# Patient Record
Sex: Female | Born: 1975 | Hispanic: Yes | Smoking: Never smoker
Health system: Southern US, Community
[De-identification: ages and names within clinical notes are randomized; demographics above are authoritative.]

## PROBLEM LIST (undated history)

## (undated) DIAGNOSIS — D649 Anemia, unspecified: Secondary | ICD-10-CM

## (undated) DIAGNOSIS — N611 Abscess of the breast and nipple: Secondary | ICD-10-CM

## (undated) HISTORY — PX: BREAST CYST ASPIRATION: SHX578

## (undated) HISTORY — PX: BREAST EXCISIONAL BIOPSY: SUR124

---
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
## 2009-09-14 ENCOUNTER — Emergency Department: Payer: Self-pay | Admitting: Emergency Medicine

## 2011-04-26 ENCOUNTER — Ambulatory Visit: Payer: Self-pay | Admitting: Family Medicine

## 2011-08-01 ENCOUNTER — Inpatient Hospital Stay: Payer: Self-pay

## 2013-07-08 ENCOUNTER — Ambulatory Visit: Payer: Self-pay | Admitting: Family Medicine

## 2015-09-10 ENCOUNTER — Other Ambulatory Visit: Payer: Self-pay | Admitting: Physician Assistant

## 2015-09-10 DIAGNOSIS — R2232 Localized swelling, mass and lump, left upper limb: Secondary | ICD-10-CM

## 2015-09-17 ENCOUNTER — Other Ambulatory Visit: Payer: Self-pay | Admitting: Physician Assistant

## 2015-09-18 ENCOUNTER — Other Ambulatory Visit: Payer: Self-pay | Admitting: Physician Assistant

## 2015-09-18 DIAGNOSIS — R2232 Localized swelling, mass and lump, left upper limb: Secondary | ICD-10-CM

## 2015-09-30 ENCOUNTER — Other Ambulatory Visit: Payer: Self-pay | Admitting: Physician Assistant

## 2015-09-30 DIAGNOSIS — R2232 Localized swelling, mass and lump, left upper limb: Secondary | ICD-10-CM

## 2015-10-01 ENCOUNTER — Ambulatory Visit: Payer: BLUE CROSS/BLUE SHIELD

## 2015-10-01 ENCOUNTER — Other Ambulatory Visit: Payer: Self-pay

## 2015-12-28 ENCOUNTER — Ambulatory Visit: Payer: BLUE CROSS/BLUE SHIELD

## 2015-12-28 ENCOUNTER — Other Ambulatory Visit: Payer: Self-pay

## 2016-01-06 ENCOUNTER — Other Ambulatory Visit: Payer: Self-pay

## 2016-01-06 ENCOUNTER — Ambulatory Visit: Payer: BLUE CROSS/BLUE SHIELD

## 2016-01-11 ENCOUNTER — Ambulatory Visit
Admission: RE | Admit: 2016-01-11 | Discharge: 2016-01-11 | Disposition: A | Discharge status: Home / Self Care | Payer: BLUE CROSS/BLUE SHIELD | Source: Ambulatory Visit | Attending: Physician Assistant | Admitting: Physician Assistant

## 2016-01-11 ENCOUNTER — Other Ambulatory Visit: Payer: Self-pay | Admitting: Physician Assistant

## 2016-01-11 DIAGNOSIS — N63 Unspecified lump in breast: Secondary | ICD-10-CM | POA: Insufficient documentation

## 2016-01-11 DIAGNOSIS — R2232 Localized swelling, mass and lump, left upper limb: Secondary | ICD-10-CM

## 2016-05-26 ENCOUNTER — Other Ambulatory Visit: Payer: Self-pay | Admitting: Family Medicine

## 2016-05-26 DIAGNOSIS — R928 Other abnormal and inconclusive findings on diagnostic imaging of breast: Secondary | ICD-10-CM

## 2016-07-11 ENCOUNTER — Ambulatory Visit: Admission: RE | Admit: 2016-07-11 | Payer: BLUE CROSS/BLUE SHIELD | Source: Ambulatory Visit

## 2016-08-08 ENCOUNTER — Ambulatory Visit
Admission: RE | Admit: 2016-08-08 | Discharge: 2016-08-08 | Disposition: A | Discharge status: Home / Self Care | Payer: BLUE CROSS/BLUE SHIELD | Source: Ambulatory Visit | Attending: Family Medicine | Admitting: Family Medicine

## 2016-08-08 DIAGNOSIS — R928 Other abnormal and inconclusive findings on diagnostic imaging of breast: Secondary | ICD-10-CM | POA: Diagnosis present

## 2016-08-08 DIAGNOSIS — N63 Unspecified lump in breast: Secondary | ICD-10-CM | POA: Insufficient documentation

## 2017-01-02 ENCOUNTER — Other Ambulatory Visit: Payer: Self-pay | Admitting: Physician Assistant

## 2017-01-02 DIAGNOSIS — R928 Other abnormal and inconclusive findings on diagnostic imaging of breast: Secondary | ICD-10-CM

## 2017-02-01 ENCOUNTER — Ambulatory Visit
Admission: RE | Admit: 2017-02-01 | Discharge: 2017-02-01 | Disposition: A | Discharge status: Home / Self Care | Payer: BLUE CROSS/BLUE SHIELD | Source: Ambulatory Visit | Attending: Physician Assistant | Admitting: Physician Assistant

## 2017-02-01 DIAGNOSIS — R928 Other abnormal and inconclusive findings on diagnostic imaging of breast: Secondary | ICD-10-CM

## 2018-02-06 ENCOUNTER — Other Ambulatory Visit: Payer: Self-pay | Admitting: Physician Assistant

## 2018-02-06 DIAGNOSIS — R928 Other abnormal and inconclusive findings on diagnostic imaging of breast: Secondary | ICD-10-CM

## 2018-03-02 ENCOUNTER — Ambulatory Visit
Admission: RE | Admit: 2018-03-02 | Discharge: 2018-03-02 | Disposition: A | Discharge status: Home / Self Care | Payer: Self-pay | Source: Ambulatory Visit | Attending: Physician Assistant | Admitting: Physician Assistant

## 2018-03-02 DIAGNOSIS — R928 Other abnormal and inconclusive findings on diagnostic imaging of breast: Secondary | ICD-10-CM | POA: Insufficient documentation

## 2018-03-02 DIAGNOSIS — N6313 Unspecified lump in the right breast, lower outer quadrant: Secondary | ICD-10-CM | POA: Insufficient documentation

## 2018-03-02 DIAGNOSIS — N6311 Unspecified lump in the right breast, upper outer quadrant: Secondary | ICD-10-CM | POA: Insufficient documentation

## 2019-02-16 ENCOUNTER — Emergency Department: Payer: Self-pay

## 2019-02-16 ENCOUNTER — Other Ambulatory Visit: Payer: Self-pay

## 2019-02-16 ENCOUNTER — Emergency Department
Admission: EM | Admit: 2019-02-16 | Discharge: 2019-02-16 | Disposition: A | Discharge status: Home / Self Care | Payer: Self-pay | Attending: Emergency Medicine | Admitting: Emergency Medicine

## 2019-02-16 ENCOUNTER — Encounter: Payer: Self-pay | Admitting: Emergency Medicine

## 2019-02-16 DIAGNOSIS — L0291 Cutaneous abscess, unspecified: Secondary | ICD-10-CM

## 2019-02-16 DIAGNOSIS — N649 Disorder of breast, unspecified: Secondary | ICD-10-CM

## 2019-02-16 DIAGNOSIS — N6459 Other signs and symptoms in breast: Secondary | ICD-10-CM | POA: Insufficient documentation

## 2019-02-16 LAB — COMPREHENSIVE METABOLIC PANEL
ALBUMIN: 4.1 g/dL (ref 3.5–5.0)
ALT: 16 U/L (ref 0–44)
AST: 24 U/L (ref 15–41)
Alkaline Phosphatase: 108 U/L (ref 38–126)
Anion gap: 8 (ref 5–15)
BUN: 15 mg/dL (ref 6–20)
CALCIUM: 8.8 mg/dL — AB (ref 8.9–10.3)
CO2: 26 mmol/L (ref 22–32)
CREATININE: 0.69 mg/dL (ref 0.44–1.00)
Chloride: 107 mmol/L (ref 98–111)
GFR calc Af Amer: >60 mL/min (ref >60)
GFR calc non Af Amer: >60 mL/min (ref >60)
GLUCOSE: 99 mg/dL (ref 70–99)
Potassium: 3.5 mmol/L (ref 3.5–5.1)
SODIUM: 141 mmol/L (ref 135–145)
Total Bilirubin: 0.2 mg/dL — ABNORMAL LOW (ref 0.3–1.2)
Total Protein: 7.5 g/dL (ref 6.5–8.1)

## 2019-02-16 LAB — CBC WITH DIFFERENTIAL/PLATELET
ABS IMMATURE GRANULOCYTES: 0.03 10*3/uL (ref 0.00–0.07)
Basophils Absolute: 0.1 10*3/uL (ref 0.0–0.1)
Basophils Relative: 1 %
EOS PCT: 3 %
Eosinophils Absolute: 0.2 10*3/uL (ref 0.0–0.5)
HEMATOCRIT: 40.9 % (ref 36.0–46.0)
Hemoglobin: 14.1 g/dL (ref 12.0–15.0)
Immature Granulocytes: 0 %
LYMPHS ABS: 2.3 10*3/uL (ref 0.7–4.0)
Lymphocytes Relative: 25 %
MCH: 31.5 pg (ref 26.0–34.0)
MCHC: 34.5 g/dL (ref 30.0–36.0)
MCV: 91.3 fL (ref 80.0–100.0)
MONO ABS: 0.6 10*3/uL (ref 0.1–1.0)
Monocytes Relative: 7 %
Neutro Abs: 5.9 10*3/uL (ref 1.7–7.7)
Neutrophils Relative %: 64 %
Platelets: 590 10*3/uL — ABNORMAL HIGH (ref 150–400)
RBC: 4.48 MIL/uL (ref 3.87–5.11)
RDW: 11.9 % (ref 11.5–15.5)
WBC: 9.2 10*3/uL (ref 4.0–10.5)
nRBC: 0 % (ref 0.0–0.2)

## 2019-02-16 LAB — LACTIC ACID, PLASMA: Lactic Acid, Venous: 1.5 mmol/L (ref 0.5–1.9)

## 2019-02-16 MED ORDER — CEPHALEXIN 500 MG PO CAPS: 500.0000 mg | ORAL_CAPSULE | Freq: Four times a day (QID) | ORAL | 0 refills | Status: DC

## 2019-02-16 MED ORDER — SULFAMETHOXAZOLE-TRIMETHOPRIM 800-160 MG PO TABS: 1.0000 | ORAL_TABLET | Freq: Two times a day (BID) | ORAL | 0 refills | Status: DC

## 2019-02-16 MED ORDER — CEPHALEXIN 500 MG PO CAPS
500.0000 mg | ORAL_CAPSULE | Freq: Once | ORAL | Status: AC
Start: 2019-02-16 — End: 2019-02-16
  Administered 2019-02-16: 500 mg via ORAL
  Filled 2019-02-16: qty 1

## 2019-02-16 MED ORDER — SULFAMETHOXAZOLE-TRIMETHOPRIM 800-160 MG PO TABS
1.0000 | ORAL_TABLET | Freq: Once | ORAL | Status: AC
  Administered 2019-02-16: 1 via ORAL
  Filled 2019-02-16: qty 1

## 2019-02-16 NOTE — ED Notes (Signed)
ED Provider and interpreter at bedside at bedside.

## 2019-02-16 NOTE — ED Notes (Signed)
Quarter sized redness just right of areola of left breast; no obvious swelling present

## 2019-02-16 NOTE — ED Triage Notes (Signed)
Interpreter Marchelle Folks present in triage; pt says she was in the ER in [facility] a week ago and had a lump in her left breast; put on Clindamycin and has been taking since 02/10/19; pt says the area is larger and the "area has changed color"; area is "reddish purple"; denies drainage; denies fever; pt in no acute distress;

## 2019-02-16 NOTE — ED Provider Notes (Signed)
[facility] Emergency Department Provider Note  ____________________________________________  Time seen: Approximately 7:40 PM  I have reviewed the triage vital signs and the nursing notes.   HISTORY  Chief Complaint Breast Mass    HPI Tamara Downs is a 43 y.o. female who presents the emergency department complaining of worsening lesion to the left breast.  Patient was seen in [facility] in emergency department but patient was unable to advise which emergency department for a breast abscess approximately a week ago.  Patient has been on clindamycin but states that the lesion has been worsening and regards to both size and pain.  Patient has no overlying skin changes.  She denies any drainage from the nipple.  No fevers or chills, abdominal pain, nausea or vomiting, chest pain or shortness of breath.  Other than clindamycin, no medications for his complaint prior to arrival.         History reviewed. No pertinent past medical history.  There are no active problems to display for this patient.   History reviewed. No pertinent surgical history.  Prior to Admission medications   Medication Sig Start Date End Date Taking? Authorizing Provider  clindamycin (CLEOCIN) 300 MG capsule Take 300 mg by mouth 4 (four) times daily.   Yes [provider]  cephALEXin (KEFLEX) 500 MG capsule Take 1 capsule (500 mg total) by mouth 4 (four) times daily. 02/16/19   Inioluwa Baris, Charline Bills, PA-C  sulfamethoxazole-trimethoprim (BACTRIM DS,SEPTRA DS) 800-160 MG tablet Take 1 tablet by mouth 2 (two) times daily. 02/16/19   Modell Fendrick, Charline Bills, PA-C    Allergies Patient has no known allergies.  Family History  Problem Relation Age of Onset  . BRCA 1/2 Neg Hx     Social History Social History   Tobacco Use  . Smoking status: Never Smoker  . Smokeless tobacco: Never Used  Substance Use Topics  . Alcohol use: Never    Frequency: Never  . Drug use:  Never     Review of Systems  Constitutional: No fever/chills Eyes: No visual changes.  Cardiovascular: no chest pain. Respiratory: no cough. No SOB. Gastrointestinal: No abdominal pain.  No nausea, no vomiting.  No diarrhea.  No constipation. Musculoskeletal: Negative for musculoskeletal pain. Skin: Negative for rash, abrasions, lacerations, ecchymosis.  Positive for worsening breast abscess to the left breast Neurological: Negative for headaches, focal weakness or numbness. 10-point ROS otherwise negative.  ____________________________________________   PHYSICAL EXAM:  VITAL SIGNS: ED Triage Vitals  Enc Vitals Group     BP 02/16/19 1843 110/70     Pulse Rate 02/16/19 1843 68     Resp 02/16/19 1843 18     Temp 02/16/19 1843 98.4 F (36.9 C)     Temp Source 02/16/19 1843 Oral     SpO2 02/16/19 1843 100 %     Weight 02/16/19 1844 144 lb (65.3 kg)     Height 02/16/19 1844 _0  (1.575 m)     Head Circumference --      Peak Flow --      Pain Score 02/16/19 1905 0     Pain Loc --      Pain Edu? --      Excl. in [place]? --      Constitutional: Alert and oriented. Well appearing and in no acute distress. Eyes: Conjunctivae are normal. PERRL. EOMI. Head: Atraumatic. Neck: No stridor.   Hematological/Lymphatic/Immunilogical: No cervical lymphadenopathy.  No axillary lymphadenopathy appreciated. Cardiovascular: Normal rate, regular rhythm. Normal S1  and S2.  Good peripheral circulation. Respiratory: Normal respiratory effort without tachypnea or retractions. Lungs CTAB. Good air entry to the bases with no decreased or absent breath sounds. Musculoskeletal: Full range of motion to all extremities. No gross deformities appreciated. Neurologic:  Normal speech and language. No gross focal neurologic deficits are appreciated.  Skin:  Skin is warm, dry and intact. No rash noted.  Visualization of the left breast reveals no overlying skin changes of erythema, edema, no peau d'orange.  No  visual changes to the nipple with retraction, discharge.  On palpation, patient has a very firm, painful lesion noted to the left medial breast.  This measures approximately 6 x 8 cm in the breast tissue.  No fluctuance appreciated.  No other palpable lesions noted to the left breast. Psychiatric: Mood and affect are normal. Speech and behavior are normal. Patient exhibits appropriate insight and judgement.   ____________________________________________   LABS (all labs ordered are listed, but only abnormal results are displayed)  Labs Reviewed  COMPREHENSIVE METABOLIC PANEL - Abnormal; Notable for the following components:      Result Value   Calcium 8.8 (*)    Total Bilirubin 0.2 (*)    All other components within normal limits  CBC WITH DIFFERENTIAL/PLATELET - Abnormal; Notable for the following components:   Platelets 590 (*)    All other components within normal limits  CULTURE, BLOOD (ROUTINE X 2)  CULTURE, BLOOD (ROUTINE X 2)  LACTIC ACID, PLASMA   ____________________________________________  EKG   ____________________________________________  RADIOLOGY I personally viewed and evaluated these images as part of my medical decision making, as well as reviewing the written report by the radiologist.  Due to interface issue, ultrasound report was not crossing over into epic.  Report was provided by radiology group.  Findings: Targeted ultrasound was performed, showing a hypoechoic region at 9:00 measuring 1.0 x 0.8 x 0.7 cm with no internal blood flow.  Immediately adjacent to this hypoechoic region is a smaller hypoechoic region measuring 7 x 6 mm.  There may be mild hyperemia in this region.  There appears to be mild skin thickening as well.  Impression:  2 rounded hypoechoic regions in the region of this patient's pain and erythema.  The largest measures 10 x 8 x 7 mm.  There may be mild hyperemia in the region.  Based on imaging, this could represent 2 solid masses.   However based on history I suspect the findings represent 2 small abscesses or phlegmon's.  Recommendation: Recommend continuing the patient on antibiotics and sending patient to the breast center for further evaluation which would include mammography if patient can tolerate an ultrasound.  Attempted abscess drainage could be performed if clinically necessary at this time.  These findings will need to be followed to complete resolution.  No results found.  ____________________________________________    PROCEDURES  Procedure(s) performed:    Procedures    Medications  cephALEXin (KEFLEX) capsule 500 mg (has no administration in time range)  sulfamethoxazole-trimethoprim (BACTRIM DS,SEPTRA DS) 800-160 MG per tablet 1 tablet (has no administration in time range)     ____________________________________________   INITIAL IMPRESSION / ASSESSMENT AND PLAN / ED COURSE  Pertinent labs & imaging results that were available during my care of the patient were reviewed by me and considered in my medical decision making (see chart for details).  Review of the [place] CSRS was performed in accordance of the [place] prior to dispensing any controlled drugs.  Patient's diagnosis is consistent with breast lesion.  Patient presents emergency department with a complaint of a worsening lesion to the left breast.  On exam, patient has significant palpable abnormality to the left medial breast tissue.  Ultrasound of the area reveals to lesions in the left breast.  They recommend follow-up with breast care center for further evaluation and management.  Patient will be given prescriptions for Bactrim and Keflex as clindamycin does not seem to have adequately treated this area.  At this time, no attempts at drainage will be performed.  Follow-up with breast center as recommended.. Patient is given ED precautions to return to the ED for any worsening or new  symptoms.     ____________________________________________  FINAL CLINICAL IMPRESSION(S) / ED DIAGNOSES  Final diagnoses:  Abscess  Breast lesion      NEW MEDICATIONS STARTED DURING THIS VISIT:  ED Discharge Orders         Ordered    cephALEXin (KEFLEX) 500 MG capsule  4 times daily     02/16/19 2238    sulfamethoxazole-trimethoprim (BACTRIM DS,SEPTRA DS) 800-160 MG tablet  2 times daily     02/16/19 2238              This chart was dictated using voice recognition software/Dragon. Despite best efforts to proofread, errors can occur which can change the meaning. Any change was purely unintentional.    Darletta Moll, PA-C 02/16/19 2238    Nance Pear, MD 02/16/19 [phone]

## 2019-02-16 NOTE — ED Notes (Signed)
Peripheral IV discontinued. Catheter intact. No signs of infiltration or redness. Gauze applied to IV site.    Discharge instructions reviewed with patient. Questions fielded by this RN. Patient verbalizes understanding of instructions. Patient discharged home in stable condition per [place], [place]. No acute distress noted at time of discharge.

## 2019-02-16 NOTE — ED Notes (Addendum)
Provider and Mya with interpreter at bedside: pt c/o mass in left medial  breast, reports taking clindamycin as prescribed but reports not getting better  Area appears without significant warmth or redness, firm mass palpated, pain with palpation  Pt reports aunt with hx of breast CA, pt denies surgical or significant medical hx with meds, 2 children at home   Hx of nodes or masses in breasts without malignancy

## 2019-02-16 NOTE — ED Notes (Signed)
Patient transported to Ultrasound 

## 2019-02-20 ENCOUNTER — Ambulatory Visit: Payer: Self-pay | Attending: Oncology

## 2019-02-20 ENCOUNTER — Other Ambulatory Visit: Payer: Self-pay

## 2019-02-20 ENCOUNTER — Telehealth: Payer: Self-pay | Admitting: *Deleted

## 2019-02-20 VITALS — BP 97/66 | HR 67 | Temp 98.5°F | Ht 62.25 in | Wt 142.0 lb

## 2019-02-20 DIAGNOSIS — N63 Unspecified lump in unspecified breast: Secondary | ICD-10-CM

## 2019-02-20 NOTE — Telephone Encounter (Signed)
"  I am Hilda Lias an interpreter from [facility] calling for [facility] [place] who was seen in the ED.    ED provided her with this number advising her to call call the Cancer Center.    What is she to do?    Do you all have a BCCCP Program in [place]?  She lives in [place], goes to [facility] in [place] but she has to pay.  Yes, she was given discharge papers but these are in her boyfriends car."    Reviewed EMR.  Seen in [place] ED.  Will notify new patient coordinator.  [facility] in [place] not noted by  This nurse at this time.  Provided ED recommendations to continue antibiotics and report. To Breast Center [phone]) for breast LTD uni left inc [place].

## 2019-02-20 NOTE — Progress Notes (Signed)
  Subjective:     Patient ID: Tamara Downs, female   DOB: 1976-09-25, 43 y.o.   MRN: [id number]  HPI   Review of Systems     Objective:   Physical Exam Chest:     Breasts:        Left: Mass, skin change and tenderness present.       Comments: 9 cm , firm  left inner breast mass with 3 cm erytmatous area adjacent to areola          Assessment:     43 year old hispanic patient presents for BCCCP clinic visit.  Patient was originally scheduled for BCCCP screening on 03/08/2019, but two weeks ago she presented to the [facility] ED, and was treated for left breast cellulitis with Clindamycin. On 02/16/2019 she presented at [facility] ED with worsening left breast symptoms.  A breast ultrasound was performed, and she was placed on Bactrim and Keflex.  She phoned today stating she was having increased pain, and the area of concern has increased in size.  Also reports breast is purple in color. Patient screened, and meets BCCCP eligibility.  Patient does not have insurance, Medicare or Medicaid.  Handout given on Affordable Care Act.  Instructed patient on breast self awareness using teach back method.  On Clinical breast exam, palpated a  9 cm , firm  left inner breast mass with 3 cm erytmatous area adjacent to areola  .    Plan:     Scheduled consult and possible biopsy with Dr. Earlene Plater at [facility] on 02/21/19 at 2:45.  Patient to arrive at 2:30 with current medications.

## 2019-02-21 ENCOUNTER — Encounter: Payer: Self-pay | Admitting: Surgery

## 2019-02-21 ENCOUNTER — Ambulatory Visit (INDEPENDENT_AMBULATORY_CARE_PROVIDER_SITE_OTHER): Payer: Self-pay | Admitting: Surgery

## 2019-02-21 ENCOUNTER — Ambulatory Visit (INDEPENDENT_AMBULATORY_CARE_PROVIDER_SITE_OTHER): Payer: Self-pay

## 2019-02-21 VITALS — BP 119/78 | HR 91 | Temp 99.9°F | Ht 64.0 in | Wt 142.0 lb

## 2019-02-21 DIAGNOSIS — N6320 Unspecified lump in the left breast, unspecified quadrant: Secondary | ICD-10-CM

## 2019-02-21 DIAGNOSIS — N611 Abscess of the breast and nipple: Secondary | ICD-10-CM

## 2019-02-21 LAB — CULTURE, BLOOD (ROUTINE X 2)
CULTURE: NO GROWTH
Culture: NO GROWTH
Special Requests: ADEQUATE

## 2019-02-21 NOTE — Patient Instructions (Signed)
Return in one week. The patient is aware to call back for any questions or concerns. 

## 2019-02-21 NOTE — Progress Notes (Signed)
Surgical Clinic History and Physical  Referring provider:  Freddy Finner, NP [place], [place] [address]  HISTORY OF PRESENT ILLNESS (HPI):  43 y.o. female presents for evaluation of Left breast pain. Patient reports she first noticed her Left breast pain 2 weeks ago, at which time she presented to [facility] ED and was prescribed clindamycin. Because her pain did not improve and the area involved became larger, she then 5 days ago (3/7) presented to [facility] ED, at which time she underwent Left breast ultrasound and her antibiotics were changed to Keflex + Bactrim. At the time of both prior presentations, no overlying skin changes were reported. Patient then called BCCCP yesterday for breast discoloration with pain and was referred to surgery office for further evaluation and management. Patient currently reports persistent Left breast pain and temperatures between 15F to 100F, denies prior similar episodes. Patient states an aunt of hers was diagnosed with breast cancer at 42 years old. Patient otherwise denies nipple drainage, N/V, CP, or SOB.  PAST MEDICAL HISTORY ([facility]):  History reviewed. No pertinent past medical history.   PAST SURGICAL HISTORY ([place]):  History reviewed. No pertinent surgical history.   MEDICATIONS:  Prior to Admission medications   Medication Sig Start Date End Date Taking? Authorizing Provider  sulfamethoxazole-trimethoprim (BACTRIM DS,SEPTRA DS) 800-160 MG tablet Take 1 tablet by mouth 2 (two) times daily. 02/16/19  Yes Cuthriell, Charline Bills, PA-C  cephALEXin (KEFLEX) 500 MG capsule Take 1 capsule (500 mg total) by mouth 4 (four) times daily. 02/16/19   Cuthriell, Charline Bills, PA-C  clindamycin (CLEOCIN) 300 MG capsule Take 300 mg by mouth 4 (four) times daily.    [provider]    ALLERGIES:  No Known Allergies   SOCIAL HISTORY:  Social History   Socioeconomic History  . Marital status: Married    Spouse name: Not on file  .  Number of children: Not on file  . Years of education: Not on file  . Highest education level: Not on file  Occupational History  . Not on file  Social Needs  . Financial resource strain: Not on file  . Food insecurity:    Worry: Not on file    Inability: Not on file  . Transportation needs:    Medical: Not on file    Non-medical: Not on file  Tobacco Use  . Smoking status: Never Smoker  . Smokeless tobacco: Never Used  Substance and Sexual Activity  . Alcohol use: Never    Frequency: Never  . Drug use: Never  . Sexual activity: Not on file  Lifestyle  . Physical activity:    Days per week: Not on file    Minutes per session: Not on file  . Stress: Not on file  Relationships  . Social connections:    Talks on phone: Not on file    Gets together: Not on file    Attends religious service: Not on file    Active member of club or organization: Not on file    Attends meetings of clubs or organizations: Not on file    Relationship status: Not on file  . Intimate partner violence:    Fear of current or ex partner: Not on file    Emotionally abused: Not on file    Physically abused: Not on file    Forced sexual activity: Not on file  Other Topics Concern  . Not on file  Social History Narrative  . Not on file  The patient currently resides (home / rehab facility / nursing home): Home The patient normally is (ambulatory / bedbound): Ambulatory  FAMILY HISTORY:  Family History  Problem Relation Age of Onset  . BRCA 1/2 Neg Hx     Otherwise negative/non-contributory.  REVIEW OF SYSTEMS:  Constitutional: denies any other weight loss, fever, chills, or sweats  Eyes: denies any other vision changes, history of eye injury  ENT: denies sore throat, hearing problems  Respiratory: denies shortness of breath, wheezing  Cardiovascular: denies chest pain, palpitations Breast: pain, redness, mass(es), and nipple drainage as per HPI Gastrointestinal: denies abdominal pain,  N/V, or diarrhea Musculoskeletal: denies any other joint pains or cramps Skin: Denies any other rashes or skin discolorations except as per HPI Neurological: denies any other headache, dizziness, weakness  Psychiatric: Denies any other depression, anxiety   All other review of systems were otherwise negative   VITAL SIGNS:  BP 119/78   Pulse 91   Temp 99.9 F (37.7 C)   Ht '5\' 4"'  (1.626 m)   Wt 142 lb (64.4 kg)   LMP 02/11/2019 (Exact Date)   SpO2 97%   BMI 24.37 kg/m   PHYSICAL EXAM:  Constitutional:  -- Normal body habitus  -- Awake, alert, and oriented x3  Eyes:  -- Pupils equally round and reactive to light  -- No scleral icterus  Ear, nose, throat:  -- No jugular venous distension -- No nasal drainage, bleeding Pulmonary:  -- No crackles  -- Equal breath sounds bilaterally -- Breathing non-labored at rest Cardiovascular:  -- S1, S2 present  -- No pericardial rubs  Breast: -- 7 cm (ML) x 3 cm (CC) firm induration, extending from subareola towards medially with 2 cm focal erythema focally worse tenderness to palpation medial to areola at 9 o'clock position -- B/L no other breast mass(es), nipple drainage, or axillary lymphadenopathy appreciated Gastrointestinal:  -- Abdomen soft, non-tender, non-distended, no guarding/rebound tenderness -- No abdominal masses appreciated, pulsatile or otherwise  Musculoskeletal and Integumentary:  -- Wounds or skin discoloration: None appreciated except as described above (Breast) -- Extremities: B/L UE and LE FROM, hands and feet warm, no edema  Neurologic:  -- Motor function: Intact and symmetric -- Sensation: Intact and symmetric  Labs:  CBC Latest Ref Rng & Units 02/16/2019  WBC 4.0 - 10.5 K/uL 9.2  Hemoglobin 12.0 - 15.0 g/dL 14.1  Hematocrit 36.0 - 46.0 % 40.9  Platelets 150 - 400 K/uL 590(H)   CMP Latest Ref Rng & Units 02/16/2019  Glucose 70 - 99 mg/dL 99  BUN 6 - 20 mg/dL 15  Creatinine 0.44 - 1.00 mg/dL 0.69   Sodium 135 - 145 mmol/L 141  Potassium 3.5 - 5.1 mmol/L 3.5  Chloride 98 - 111 mmol/L 107  CO2 22 - 32 mmol/L 26  Calcium 8.9 - 10.3 mg/dL 8.8(L)  Total Protein 6.5 - 8.1 g/dL 7.5  Total Bilirubin 0.3 - 1.2 mg/dL 0.2(L)  Alkaline Phos 38 - 126 U/L 108  AST 15 - 41 U/L 24  ALT 0 - 44 U/L 16   Imaging studies:  Left Breast Focal Ultrasound (02/16/2019) Targeted ultrasound is performed, showing a hypoechoic region at 9 o'clock measuring 1.0 x 0.8 x 0.7 cm with no internal blood flow. Immediately adjacent to this hypoechoic region is a smaller hypoechoic region measuring 7 x 6 mm. There may be mild hyperemia in this region. There appears to be mild skin thickening as well.  Two rounded hypoechoic regions in the region of  the patient's pain and erythema. The larger measures 10 x 8 x 7 mm. There may be mild hyperemia in this region. Based on imaging, this could represent 2 solid masses. However, based on history, I suspect the findings represent 2 small abscesses or phlegmons.   Assessment/Plan:  43 y.o. female with Left breast abscess and surrounding induration, developing over past 2 weeks.   - discussed with patient aspiration vs incision and drainage of abscess  - all risks, benefits, and alternatives to ultrasound-guided aspiration of Left breast abscess were discussed with the patient and her husband, all of their questions were answered to their expressed satisfaction, patient expresses she wishes to proceed with ultrasound-guided aspiration of her Left breast abscess, and informed consent was obtained accordingly.\  - ultrasound-guided aspiration of Left breast abscess performed, will follow up cultures  - complete prescribed course of antibiotics (patient reports 3 - 5 days remaining)  - may remove adhesive bandage later today and apply warm compresses, may shower  - return to clinic in 1 week with follow-up clinical exam and ultrasound  - discussed repeat aspiration vs incision  and drainage if not resolved  - instructed to call office if any questions or concerns  All of the above recommendations were discussed with the patient and patient's husband, and all of patient's and family's questions were answered to their expressed satisfaction.  Thank you for the opportunity to participate in this patient's care.  -- Marilynne Drivers Rosana Hoes, MD, [place]: [facility] - Partnering for exceptional care. Office: [phone]

## 2019-02-21 NOTE — Procedures (Addendum)
SURGICAL OPERATIVE REPORT  DATE OF PROCEDURE: 02/21/2019  ATTENDING: Barbara Cower E. Earlene Plater, MD  ANESTHESIA: Local  PRE-OPERATIVE DIAGNOSIS: Left breast abscess (icd-10's: N61.1)  POST-OPERATIVE DIAGNOSIS: Left breast abscess (icd-10's: N61.1)  PROCEDURE(S):  1.) Focal Left breast ultrasound (cpt: 00867) 2.) Ultrasound-guided aspiration of Left breast abscesses (cpt: 10160)  INTRAOPERATIVE FINDINGS: ~4 mL of pus mixed with blood from a 1.3 cm x 0.7 cm Left medial breast abscess and a small amount from a second smaller adjacent subcentimeter hypoechoic lesion as well; immediate reported relief of "pressure" following aspiration, a newly soft central area overlying site of aspirated abscess, and improved erythema  INTRAVENOUS FLUIDS: 0 mL crystalloid   ESTIMATED BLOOD LOSS: Minimal (<20 mL)   URINE OUTPUT: No Foley catheter   SPECIMENS: Contents of Left breast abscess for culture (container for histology not available at the time of aspiration)  IMPLANTS: None  DRAINS: None  COMPLICATIONS: None apparent  CONDITION AT END OF PROCEDURE: Hemodynamically stable and awake  INDICATIONS FOR PROCEDURE:  Patient is a 43 y.o. female who presented to outpatient surgery office for evaluation of Left breast pain. Patient reports she first noticed her Left breast pain 2 weeks ago, at which time she presented to [facility] ED and was prescribed clindamycin. Because her pain did not improve and the area involved became larger, she 5 days ago (3/7) presented to [facility] ED, at which time she underwent Left breast ultrasound and antibiotics were changed to Keflex + Bactrim. At the time of both prior presentations, no overlying skin changes were reported. Patient then called BCCCP yesterday for breast discoloration with pain and was referred to surgery office for further evaluation and management. Patient reports persistent Left breast pain and  temperatures between 16F to 100F, denies prior similar episodes. Patient states an aunt of hers was diagnosed with breast cancer at 42 years old. Patient otherwise denies nipple drainage, N/V, CP, or SOB. All risks, benefits, and alternatives to aspiration vs incision and drainage of Left breast abscess were discussed with patient, all of her questions were answered to her expressed satisfaction, and informed consent was obtained.  DETAILS OF PROCEDURE: Patient was brought to the procedure room and appropriately identified. In supine position, bedside ultrasound was performed to assess for abscess amenable to percutaneous aspiration. Procedural site was cleaned with alcohol-based solution, and following a brief time out, local anesthetic was injected using a 25G needle, and a 20G needle was used to aspirate the larger of two hypoechoic lesions consistent with abscess cavities. and a ~4 mL of pus mixed with blood from a 1.3 cm x 0.7 cm Left medial breast abscess and a small amount of pus from a second smaller adjacent subcentimeter hypoechoic lesion as well were obtained and placed in culture media. Via certified medical Spanish-English translator, patient expressed immediate relief from pre-aspiration "pressure" with improved erythema. Skin was then cleaned and dried, and an adhesive bandage was applied.  I was present for all aspects of the above procedure, and no operative complications were apparent.

## 2019-02-27 LAB — ANAEROBIC AND AEROBIC CULTURE

## 2019-02-28 ENCOUNTER — Other Ambulatory Visit: Payer: Self-pay

## 2019-02-28 ENCOUNTER — Encounter: Payer: Self-pay | Admitting: Surgery

## 2019-02-28 ENCOUNTER — Ambulatory Visit (INDEPENDENT_AMBULATORY_CARE_PROVIDER_SITE_OTHER): Payer: Self-pay | Admitting: Surgery

## 2019-02-28 VITALS — BP 104/71 | HR 63 | Temp 97.5°F | Ht 64.0 in | Wt 142.4 lb

## 2019-02-28 DIAGNOSIS — N611 Abscess of the breast and nipple: Secondary | ICD-10-CM

## 2019-02-28 DIAGNOSIS — Z4889 Encounter for other specified surgical aftercare: Secondary | ICD-10-CM

## 2019-02-28 HISTORY — DX: Abscess of the breast and nipple: N61.1
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
## 2019-02-28 NOTE — Patient Instructions (Addendum)
Patient is to return to the office in 2 weeks.   Call the office with any questions or concerns.  

## 2019-03-04 NOTE — Progress Notes (Signed)
Per follow-up with Dr. Earlene Plater, patient improved.  To return to his office in two weeks for follow-up.  Copy to [facility].

## 2019-03-06 ENCOUNTER — Ambulatory Visit: Payer: Self-pay

## 2019-03-11 ENCOUNTER — Ambulatory Visit (INDEPENDENT_AMBULATORY_CARE_PROVIDER_SITE_OTHER): Payer: Self-pay | Admitting: Surgery

## 2019-03-11 ENCOUNTER — Other Ambulatory Visit: Payer: Self-pay

## 2019-03-11 ENCOUNTER — Encounter: Payer: Self-pay | Admitting: Surgery

## 2019-03-11 ENCOUNTER — Telehealth: Payer: Self-pay

## 2019-03-11 ENCOUNTER — Encounter: Payer: Self-pay | Admitting: *Deleted

## 2019-03-11 VITALS — BP 99/65 | HR 72 | Temp 97.7°F | Resp 16 | Ht 62.0 in | Wt 142.8 lb

## 2019-03-11 DIAGNOSIS — N611 Abscess of the breast and nipple: Secondary | ICD-10-CM

## 2019-03-11 MED ORDER — CLINDAMYCIN PHOSPHATE 900 MG/50ML IV SOLN
900.0000 mg | INTRAVENOUS | Status: AC
  Administered 2019-03-12: 900 mg via INTRAVENOUS

## 2019-03-11 MED ORDER — AMOXICILLIN-POT CLAVULANATE 875-125 MG PO TABS: 1.0000 | ORAL_TABLET | Freq: Two times a day (BID) | ORAL | 0 refills | Status: AC

## 2019-03-11 NOTE — H&P (View-Only) (Signed)
Patient ID: Tamara Downs, female   DOB: August 26, 1976, 43 y.o.   MRN: [id number]  HPI Faten Frieson is a 43 y.o. female known to our practice with a prior history of breast abscess aspirated by my partner Dr. Tama High 2-1/2 weeks ago.  She states that now she is got some drainage and she was recently prescribed Keflex by PCP.  She now reports decreasing drainage.  No fevers no chills.  She does have significant pain on her left breast that is sharp intermittent and worsening when she wears a bra.  Did have an ultrasound of the left breast that I have personally reviewed showing this of 2 small collections.  There is no growth on the aspirate.   HPI  History reviewed. No pertinent past medical history.  History reviewed. No pertinent surgical history.  Family History  Problem Relation Age of Onset  . BRCA 1/2 Neg Hx     Social History Social History   Tobacco Use  . Smoking status: Never Smoker  . Smokeless tobacco: Never Used  Substance Use Topics  . Alcohol use: Never    Frequency: Never  . Drug use: Never    No Known Allergies  Current Outpatient Medications  Medication Sig Dispense Refill  . cephALEXin (KEFLEX) 500 MG capsule TAKE 1 CAPSULE BY MOUTH THREE TIMES DAILY FOR INFECTION FOR 10 DAYS    . amoxicillin-clavulanate (AUGMENTIN) 875-125 MG tablet Take 1 tablet by mouth 2 (two) times daily for 10 days. 20 tablet 0   No current facility-administered medications for this visit.      Review of Systems Full ROS  was asked and was negative except for the information on the HPI  Physical Exam Blood pressure 99/65, pulse 72, temperature 97.7 F (36.5 C), temperature source Temporal, resp. rate 16, height '5\' 2"'  (1.575 m), weight 142 lb 12.8 oz (64.8 kg), last menstrual period 02/11/2019, SpO2 98 %. CONSTITUTIONAL: NAD EYES: Pupils are equal, round, and reactive to light, Sclera are non-icteric. EARS, NOSE, MOUTH AND THROAT: The oropharynx is clear.  The oral mucosa is pink and moist. Hearing is intact to voice. LYMPH NODES:  Lymph nodes in the neck are normal. RESPIRATORY:  Lungs are clear. There is normal respiratory effort, with equal breath sounds bilaterally, and without pathologic use of accessory muscles. CARDIOVASCULAR: Heart is regular without murmurs, gallops, or rubs. BREAST: chaperone present.  There is an area of fluctuance and induration located on the left breast.  There is some erythema and exquisite tenderness to palpation GI: The abdomen is  soft, nontender, and nondistended. There are no palpable masses. There is no hepatosplenomegaly. There are normal bowel sounds in all quadrants. GU: Rectal deferred.   MUSCULOSKELETAL: Normal muscle strength and tone. No cyanosis or edema.   SKIN: Turgor is good and there are no pathologic skin lesions or ulcers. NEUROLOGIC: Motor and sensation is grossly normal. Cranial nerves are grossly intact. PSYCH:  Oriented to person, place and time. Affect is normal.  Data Reviewed  I have personally reviewed the patient's imaging, laboratory findings and medical records.    Assessment/Plan Persistent and recurrent left breast abscess.  Given that she has failed medical therapy and aspiration I do recommend formal I&D in the OR.  Gust with the patient in detail about the procedure.  Risk benefits and possible applications including but not limited to: Bleeding, infection, persistent drainage.  Chronic wound and deformity issues.  She understands and wishes to proceed And we will  go ahead and start her on Augmentin and discontinue the Keflex for now we will obtain another culture tomorrow in the OR. Patient given circumstances is considered an urgent case of need to proceed to the operating room despite the coughing epidemic.  I do not think that we can wait because the consequences of waiting will potentially have disastrous consequences such as sepsis and a major necrotizing infection to the left  breast.  Carrying a Significant morbidity and potential mortality Time spent with the patient was 48mnutes, with more than 50% of the time spent in face-to-face education, counseling and care coordination.     DCaroleen Hamman MD FACS General Surgeon 03/11/2019, 5:48 PM

## 2019-03-11 NOTE — Telephone Encounter (Signed)
Spoke with interpreter and she stated the patient seen PCP  and was prescribed antibiotic Saturday. She stated the drainage has almost stopped, white to clear color.   No fever or chills.  Per Dr.Pabon have patient come in to be seen today.   Spoke with interpreter [facility] and patient   RD

## 2019-03-11 NOTE — Patient Instructions (Addendum)
Please pick up medication at the pharmacy.  

## 2019-03-11 NOTE — Progress Notes (Signed)
Patient's surgery has been scheduled for 03-12-19 at [facility] with Dr. Everlene Farrier. The patient does require a Spanish interpreter and O.R. notified of this.   The patient is aware to [facility] desk at 8:45 am.   She is aware to have a driver and be NPO after midnight.   Patient notified that driver must wait in the car during the procedure due to COVID-19 restrictions.   Patient also notified that [facility] does not cover anesthesia charges and was given Sheena Lambert's number. She verbalizes understanding.   *Spanish Interpreter, Aquilla Solian was present today.

## 2019-03-11 NOTE — Progress Notes (Signed)
Patient ID: [name], female   DOB: [birth year], 43 y.o.   MRN: [id number]  HPI Tamara Downs is a 43 y.o. female known to our practice with a prior history of breast abscess aspirated by my partner Dr. Tama High 2-1/2 weeks ago.  She states that now she is got some drainage and she was recently prescribed Keflex by PCP.  She now reports decreasing drainage.  No fevers no chills.  She does have significant pain on her left breast that is sharp intermittent and worsening when she wears a bra.  Did have an ultrasound of the left breast that I have personally reviewed showing this of 2 small collections.  There is no growth on the aspirate.   HPI  History reviewed. No pertinent past medical history.  History reviewed. No pertinent surgical history.  Family History  Problem Relation Age of Onset  . BRCA 1/2 Neg Hx     Social History Social History   Tobacco Use  . Smoking status: Never Smoker  . Smokeless tobacco: Never Used  Substance Use Topics  . Alcohol use: Never    Frequency: Never  . Drug use: Never    No Known Allergies  Current Outpatient Medications  Medication Sig Dispense Refill  . cephALEXin (KEFLEX) 500 MG capsule TAKE 1 CAPSULE BY MOUTH THREE TIMES DAILY FOR INFECTION FOR 10 DAYS    . amoxicillin-clavulanate (AUGMENTIN) 875-125 MG tablet Take 1 tablet by mouth 2 (two) times daily for 10 days. 20 tablet 0   No current facility-administered medications for this visit.      Review of Systems Full ROS  was asked and was negative except for the information on the HPI  Physical Exam Blood pressure 99/65, pulse 72, temperature 97.7 F (36.5 C), temperature source Temporal, resp. rate 16, height '5\' 2"'  (1.575 m), weight 142 lb 12.8 oz (64.8 kg), last menstrual period 02/11/2019, SpO2 98 %. CONSTITUTIONAL: NAD EYES: Pupils are equal, round, and reactive to light, Sclera are non-icteric. EARS, NOSE, MOUTH AND THROAT: The oropharynx is clear.  The oral mucosa is pink and moist. Hearing is intact to voice. LYMPH NODES:  Lymph nodes in the neck are normal. RESPIRATORY:  Lungs are clear. There is normal respiratory effort, with equal breath sounds bilaterally, and without pathologic use of accessory muscles. CARDIOVASCULAR: Heart is regular without murmurs, gallops, or rubs. BREAST: chaperone present.  There is an area of fluctuance and induration located on the left breast.  There is some erythema and exquisite tenderness to palpation GI: The abdomen is  soft, nontender, and nondistended. There are no palpable masses. There is no hepatosplenomegaly. There are normal bowel sounds in all quadrants. GU: Rectal deferred.   MUSCULOSKELETAL: Normal muscle strength and tone. No cyanosis or edema.   SKIN: Turgor is good and there are no pathologic skin lesions or ulcers. NEUROLOGIC: Motor and sensation is grossly normal. Cranial nerves are grossly intact. PSYCH:  Oriented to person, place and time. Affect is normal.  Data Reviewed  I have personally reviewed the patient's imaging, laboratory findings and medical records.    Assessment/Plan Persistent and recurrent left breast abscess.  Given that she has failed medical therapy and aspiration I do recommend formal I&D in the OR.  Gust with the patient in detail about the procedure.  Risk benefits and possible applications including but not limited to: Bleeding, infection, persistent drainage.  Chronic wound and deformity issues.  She understands and wishes to proceed And we will  go ahead and start her on Augmentin and discontinue the Keflex for now we will obtain another culture tomorrow in the OR. Patient given circumstances is considered an urgent case of need to proceed to the operating room despite the coughing epidemic.  I do not think that we can wait because the consequences of waiting will potentially have disastrous consequences such as sepsis and a major necrotizing infection to the left  breast.  Carrying a Significant morbidity and potential mortality Time spent with the patient was 59mnutes, with more than 50% of the time spent in face-to-face education, counseling and care coordination.     DCaroleen Hamman MD FACS General Surgeon 03/11/2019, 5:48 PM

## 2019-03-12 ENCOUNTER — Encounter: Payer: Self-pay | Admitting: *Deleted

## 2019-03-12 ENCOUNTER — Other Ambulatory Visit: Payer: Self-pay

## 2019-03-12 ENCOUNTER — Encounter
Admission: RE | Disposition: A | Discharge status: Home / Self Care | Payer: Self-pay | Source: Home / Self Care | Attending: Surgery

## 2019-03-12 ENCOUNTER — Ambulatory Visit
Admission: RE | Admit: 2019-03-12 | Discharge: 2019-03-12 | Disposition: A | Discharge status: Home / Self Care | Payer: Self-pay | Attending: Surgery | Admitting: Surgery

## 2019-03-12 ENCOUNTER — Ambulatory Visit: Payer: Self-pay | Admitting: Certified Registered Nurse Anesthetist

## 2019-03-12 DIAGNOSIS — N611 Abscess of the breast and nipple: Secondary | ICD-10-CM

## 2019-03-12 HISTORY — PX: INCISION AND DRAINAGE ABSCESS: SHX5864

## 2019-03-12 LAB — POCT PREGNANCY, URINE: Preg Test, Ur: NEGATIVE

## 2019-03-12 SURGERY — INCISION AND DRAINAGE, ABSCESS
Anesthesia: General | Laterality: Left

## 2019-03-12 MED ORDER — CHLORHEXIDINE GLUCONATE CLOTH 2 % EX PADS: 6.0000 | MEDICATED_PAD | Freq: Once | CUTANEOUS | Status: DC

## 2019-03-12 MED ORDER — OXYCODONE HCL 5 MG PO TABS
5.0000 mg | ORAL_TABLET | Freq: Once | ORAL | Status: AC | PRN
  Administered 2019-03-12: 5 mg via ORAL

## 2019-03-12 MED ORDER — FAMOTIDINE 20 MG PO TABS
ORAL_TABLET | ORAL | Status: AC
  Administered 2019-03-12: 20 mg via ORAL
  Filled 2019-03-12: qty 1

## 2019-03-12 MED ORDER — GABAPENTIN 300 MG PO CAPS
ORAL_CAPSULE | ORAL | Status: AC
  Administered 2019-03-12: 300 mg via ORAL
  Filled 2019-03-12: qty 1

## 2019-03-12 MED ORDER — CELECOXIB 200 MG PO CAPS
200.0000 mg | ORAL_CAPSULE | ORAL | Status: AC
  Administered 2019-03-12: 200 mg via ORAL

## 2019-03-12 MED ORDER — GLYCOPYRROLATE 0.2 MG/ML IJ SOLN
INTRAMUSCULAR | Status: DC | PRN
  Administered 2019-03-12: 0.2 mg via INTRAVENOUS

## 2019-03-12 MED ORDER — LIDOCAINE HCL 1 % IJ SOLN
INTRAMUSCULAR | Status: DC | PRN
  Administered 2019-03-12: 10 mL via INTRAMUSCULAR

## 2019-03-12 MED ORDER — PROPOFOL 10 MG/ML IV BOLUS
INTRAVENOUS | Status: AC
  Filled 2019-03-12: qty 20

## 2019-03-12 MED ORDER — LIDOCAINE HCL (PF) 2 % IJ SOLN
INTRAMUSCULAR | Status: AC
  Filled 2019-03-12: qty 10

## 2019-03-12 MED ORDER — ACETAMINOPHEN 500 MG PO TABS
1000.0000 mg | ORAL_TABLET | ORAL | Status: AC
  Administered 2019-03-12: 1000 mg via ORAL

## 2019-03-12 MED ORDER — PROPOFOL 10 MG/ML IV BOLUS
INTRAVENOUS | Status: DC | PRN
  Administered 2019-03-12: 160 mg via INTRAVENOUS

## 2019-03-12 MED ORDER — MIDAZOLAM HCL 2 MG/2ML IJ SOLN
INTRAMUSCULAR | Status: AC
  Filled 2019-03-12: qty 2

## 2019-03-12 MED ORDER — PHENYLEPHRINE HCL 10 MG/ML IJ SOLN
INTRAMUSCULAR | Status: DC | PRN
  Administered 2019-03-12 (×3): 100 ug via INTRAVENOUS

## 2019-03-12 MED ORDER — DEXAMETHASONE SODIUM PHOSPHATE 10 MG/ML IJ SOLN
INTRAMUSCULAR | Status: DC | PRN
  Administered 2019-03-12: 10 mg via INTRAVENOUS

## 2019-03-12 MED ORDER — BUPIVACAINE HCL (PF) 0.5 % IJ SOLN
INTRAMUSCULAR | Status: AC
  Filled 2019-03-12: qty 30

## 2019-03-12 MED ORDER — FENTANYL CITRATE (PF) 100 MCG/2ML IJ SOLN: 25.0000 ug | INTRAMUSCULAR | Status: DC | PRN

## 2019-03-12 MED ORDER — LIDOCAINE HCL (PF) 1 % IJ SOLN
INTRAMUSCULAR | Status: AC
  Filled 2019-03-12: qty 30

## 2019-03-12 MED ORDER — OXYCODONE HCL 5 MG PO TABS
ORAL_TABLET | ORAL | Status: AC
  Administered 2019-03-12: 5 mg via ORAL
  Filled 2019-03-12: qty 1

## 2019-03-12 MED ORDER — MIDAZOLAM HCL 2 MG/2ML IJ SOLN
INTRAMUSCULAR | Status: DC | PRN
  Administered 2019-03-12: 2 mg via INTRAVENOUS

## 2019-03-12 MED ORDER — OXYCODONE HCL 5 MG/5ML PO SOLN: 5.0000 mg | Freq: Once | ORAL | Status: AC | PRN

## 2019-03-12 MED ORDER — ACETAMINOPHEN 500 MG PO TABS
ORAL_TABLET | ORAL | Status: AC
  Administered 2019-03-12: 1000 mg via ORAL
  Filled 2019-03-12: qty 2

## 2019-03-12 MED ORDER — CELECOXIB 200 MG PO CAPS
ORAL_CAPSULE | ORAL | Status: AC
  Administered 2019-03-12: 200 mg via ORAL
  Filled 2019-03-12: qty 1

## 2019-03-12 MED ORDER — LIDOCAINE HCL (CARDIAC) PF 100 MG/5ML IV SOSY
PREFILLED_SYRINGE | INTRAVENOUS | Status: DC | PRN
  Administered 2019-03-12: 100 mg via INTRAVENOUS

## 2019-03-12 MED ORDER — FENTANYL CITRATE (PF) 100 MCG/2ML IJ SOLN
INTRAMUSCULAR | Status: AC
  Filled 2019-03-12: qty 2

## 2019-03-12 MED ORDER — ONDANSETRON HCL 4 MG/2ML IJ SOLN
INTRAMUSCULAR | Status: DC | PRN
  Administered 2019-03-12: 4 mg via INTRAVENOUS

## 2019-03-12 MED ORDER — FENTANYL CITRATE (PF) 100 MCG/2ML IJ SOLN
INTRAMUSCULAR | Status: DC | PRN
  Administered 2019-03-12: 50 ug via INTRAVENOUS

## 2019-03-12 MED ORDER — OXYCODONE-ACETAMINOPHEN 5-325 MG PO TABS: 1.0000 | ORAL_TABLET | ORAL | 0 refills | Status: DC | PRN

## 2019-03-12 MED ORDER — GABAPENTIN 300 MG PO CAPS
300.0000 mg | ORAL_CAPSULE | ORAL | Status: AC
  Administered 2019-03-12: 300 mg via ORAL

## 2019-03-12 MED ORDER — CLINDAMYCIN PHOSPHATE 900 MG/50ML IV SOLN
INTRAVENOUS | Status: AC
  Filled 2019-03-12: qty 50

## 2019-03-12 MED ORDER — LACTATED RINGERS IV SOLN
INTRAVENOUS | Status: DC
  Administered 2019-03-12: 12:00:00 via INTRAVENOUS

## 2019-03-12 MED ORDER — FAMOTIDINE 20 MG PO TABS
20.0000 mg | ORAL_TABLET | Freq: Once | ORAL | Status: AC
  Administered 2019-03-12: 20 mg via ORAL

## 2019-03-12 SURGICAL SUPPLY — 23 items
BLADE CLIPPER SURG (BLADE) ×3 IMPLANT
BLADE SURG 15 STRL LF DISP TIS (BLADE) ×1 IMPLANT
BLADE SURG 15 STRL SS (BLADE) ×2
BRUSH SCRUB EZ  4% CHG (MISCELLANEOUS) ×2
BRUSH SCRUB EZ 4% CHG (MISCELLANEOUS) ×1 IMPLANT
CANISTER SUCT 3000ML PPV (MISCELLANEOUS) ×3 IMPLANT
COVER WAND RF STERILE (DRAPES) ×3 IMPLANT
DRAIN PENROSE 1/4X12 LTX (DRAIN) ×3 IMPLANT
DRAPE LAPAROTOMY 77X122 PED (DRAPES) ×3 IMPLANT
ELECT REM PT RETURN 9FT ADLT (ELECTROSURGICAL) ×3
ELECTRODE REM PT RTRN 9FT ADLT (ELECTROSURGICAL) ×1 IMPLANT
GAUZE SPONGE 4X4 12PLY STRL (GAUZE/BANDAGES/DRESSINGS) ×3 IMPLANT
GLOVE BIO SURGEON STRL SZ7 (GLOVE) ×3 IMPLANT
GOWN STRL REUS W/ TWL LRG LVL3 (GOWN DISPOSABLE) ×2 IMPLANT
GOWN STRL REUS W/TWL LRG LVL3 (GOWN DISPOSABLE) ×4
NEEDLE HYPO 22GX1.5 SAFETY (NEEDLE) ×3 IMPLANT
NS IRRIG 1000ML POUR BTL (IV SOLUTION) ×3 IMPLANT
PACK BASIN MINOR ARMC (MISCELLANEOUS) ×3 IMPLANT
SOL PREP PVP 2OZ (MISCELLANEOUS) ×3
SOLUTION PREP PVP 2OZ (MISCELLANEOUS) ×1 IMPLANT
SPONGE LAP 18X18 RF (DISPOSABLE) ×3 IMPLANT
SUT ETHILON 3-0 FS-10 30 BLK (SUTURE) ×3
SUTURE EHLN 3-0 FS-10 30 BLK (SUTURE) ×1 IMPLANT

## 2019-03-12 NOTE — Anesthesia Preprocedure Evaluation (Signed)
Anesthesia Evaluation  Patient identified by MRN, date of birth, ID band Patient awake    Reviewed: Allergy & Precautions, H&P , NPO status , Patient's Chart, lab work & pertinent test results  Airway Mallampati: III  TM Distance: <3 FB Neck ROM: full    Dental  (+) Chipped   Pulmonary neg pulmonary ROS, neg shortness of breath,           Cardiovascular Exercise Tolerance: Good hypertension, (-) angina(-) Past MI      Neuro/Psych negative neurological ROS  negative psych ROS   GI/Hepatic negative GI ROS, Neg liver ROS, neg GERD  ,  Endo/Other  negative endocrine ROS  Renal/GU      Musculoskeletal   Abdominal   Peds  Hematology negative hematology ROS (+)   Anesthesia Other Findings History reviewed. No pertinent past medical history.  History reviewed. No pertinent surgical history.  BMI    Body Mass Index:  26.09 kg/m      Reproductive/Obstetrics negative OB ROS                             Anesthesia Physical Anesthesia Plan  ASA: II  Anesthesia Plan: General LMA   Post-op Pain Management:    Induction: Intravenous  PONV Risk Score and Plan: Dexamethasone, Ondansetron, Midazolam and Treatment may vary due to age or medical condition  Airway Management Planned: LMA  Additional Equipment:   Intra-op Plan:   Post-operative Plan: Extubation in OR  Informed Consent: I have reviewed the patients History and Physical, chart, labs and discussed the procedure including the risks, benefits and alternatives for the proposed anesthesia with the patient or authorized representative who has indicated his/her understanding and acceptance.     Dental Advisory Given  Plan Discussed with: Anesthesiologist, CRNA and Surgeon  Anesthesia Plan Comments: (Patient consented for risks of anesthesia including but not limited to:  - adverse reactions to medications - damage to teeth, lips  or other oral mucosa - sore throat or hoarseness - Damage to heart, brain, lungs or loss of life  Patient voiced understanding.)        Anesthesia Quick Evaluation

## 2019-03-12 NOTE — Interval H&P Note (Signed)
History and Physical Interval Note:  03/12/2019 12:04 PM  Tamara Downs  has presented today for surgery, with the diagnosis of LEFT BREAST ABSCESS.  The various methods of treatment have been discussed with the patient and family. After consideration of risks, benefits and other options for treatment, the patient has consented to  Procedure(s): INCISION AND DRAINAGE LEFT BREAST ABSCESS (Left) as a surgical intervention.  The patient's history has been reviewed, patient examined, no change in status, stable for surgery.  I have reviewed the patient's chart and labs.  Questions were answered to the patient's satisfaction.     Ancil Linsey

## 2019-03-12 NOTE — Discharge Instructions (Addendum)
AMBULATORY SURGERY  DISCHARGE INSTRUCTIONS   1) The drugs that you were given will stay in your system until tomorrow so for the next 24 hours you should not:  A) Drive an automobile B) Make any legal decisions C) Drink any alcoholic beverage   2) You may resume regular meals tomorrow.  Today it is better to start with liquids and gradually work up to solid foods.  You may eat anything you prefer, but it is better to start with liquids, then soup and crackers, and gradually work up to solid foods.   3) Please notify your doctor immediately if you have any unusual bleeding, trouble breathing, redness and pain at the surgery site, drainage, fever, or pain not relieved by medication.    4) Additional Instructions:        Please contact your physician with any problems or Same Day Surgery at [phone], Monday through Friday 6 am to 4 pm, or [place] at [facility] number at [phone].   In addition to included general post-operative instructions for Incision and Drainage of Left Breast Abscess,  Diet: Resume home heart healthy diet.   Activity: No heavy lifting >20 pounds (children, pets, laundry, garbage) or strenuous activity until follow-up in 2 weeks, but light activity and walking are encouraged.  Wound care: Remove dressing in 2 days and change dry gauze dressing once every day. You may shower/get incision wet with soapy water and pat dry (do not rub incisions), but no baths or submerging incision underwater until follow-up.   Medications: Resume all home medications AND Complete prescribed course of Augmentin antibiotic x 10 Days. For mild to moderate pain: acetaminophen (Tylenol) or ibuprofen/naproxen (if no kidney disease). Combining Tylenol with alcohol can substantially increase your risk of causing liver disease.  Call office [phone]) at any time if any questions, worsening pain, fevers/chills, bleeding, drainage from incision site, or other  concerns.

## 2019-03-12 NOTE — Anesthesia Procedure Notes (Signed)
Procedure Name: LMA Insertion Performed by: Murl Zogg, CRNA Pre-anesthesia Checklist: Patient identified, Patient being monitored, Timeout performed, Emergency Drugs available and Suction available Patient Re-evaluated:Patient Re-evaluated prior to induction Oxygen Delivery Method: Circle system utilized Preoxygenation: Pre-oxygenation with 100% oxygen Induction Type: IV induction Ventilation: Mask ventilation without difficulty LMA: LMA inserted LMA Size: 3.5 Tube type: Oral Number of attempts: 1 Placement Confirmation: positive ETCO2 and breath sounds checked- equal and bilateral Tube secured with: Tape Dental Injury: Teeth and Oropharynx as per pre-operative assessment        

## 2019-03-12 NOTE — Anesthesia Postprocedure Evaluation (Signed)
Anesthesia Post Note  Patient: Tamara Downs  Procedure(s) Performed: INCISION AND DRAINAGE LEFT BREAST ABSCESS (Left )  Patient location during evaluation: PACU Anesthesia Type: General Level of consciousness: awake and alert Pain management: pain level controlled Vital Signs Assessment: post-procedure vital signs reviewed and stable Respiratory status: spontaneous breathing, nonlabored ventilation, respiratory function stable and patient connected to nasal cannula oxygen Cardiovascular status: blood pressure returned to baseline and stable Postop Assessment: no apparent nausea or vomiting Anesthetic complications: no     Last Vitals:  Vitals:   03/12/19 1356 03/12/19 1407  BP: (!) 114/46 119/77  Pulse: 60 (!) 58  Resp:  16  Temp: (!) 36.2 C (!) 36.3 C  SpO2: 100% 100%    Last Pain:  Vitals:   03/12/19 1407  TempSrc: Temporal  PainSc: 3                  Cleda Mccreedy Piscitello

## 2019-03-12 NOTE — Anesthesia Post-op Follow-up Note (Signed)
Anesthesia QCDR form completed.        

## 2019-03-12 NOTE — Transfer of Care (Signed)
Immediate Anesthesia Transfer of Care Note  Patient: Tamara Downs  Procedure(s) Performed: INCISION AND DRAINAGE LEFT BREAST ABSCESS (Left )  Patient Location: PACU  Anesthesia Type:General  Level of Consciousness: sedated  Airway & Oxygen Therapy: Patient Spontanous Breathing and Patient connected to face mask oxygen  Post-op Assessment: Report given to RN and Post -op Vital signs reviewed and stable  Post vital signs: Reviewed and stable  Last Vitals:  Vitals Value Taken Time  BP    Temp    Pulse    Resp    SpO2      Last Pain:  Vitals:   03/12/19 0849  TempSrc: Temporal  PainSc: 0-No pain         Complications: No apparent anesthesia complications

## 2019-03-12 NOTE — Op Note (Addendum)
SURGICAL OPERATIVE REPORT  DATE OF PROCEDURE: 03/12/2019  ATTENDING Surgeon(s): Ancil Linsey, MD  ANESTHESIA: LMA (GETA)  PRE-OPERATIVE DIAGNOSIS: Left breast abscess refractory to aspiration (icd-10's: N61.1)  POST-OPERATIVE DIAGNOSIS: Left breast abscess refractory to aspiration (icd-10's: N61.1)  PROCEDURE(S):  1.) Incision and drainage of complex/loculated Left breast abscess (cpt: 19020)  INTRAOPERATIVE FINDINGS: ~15 mL of tan purulent fluid under significant pressure from Left breast  INTRAVENOUS FLUIDS: 400 mL crystalloid   ESTIMATED BLOOD LOSS: Minimal (<20 mL)   URINE OUTPUT: No Foley catheter   SPECIMENS: Contents of Left  abscess for culture  IMPLANTS: None  DRAINS: 1/4" Penrose drain in the Left breast abscess cavity  COMPLICATIONS: None apparent  CONDITION AT END OF PROCEDURE: Hemodynamically stable and extubated  DISPOSITION OF PATIENT: PACU  INDICATIONS FOR PROCEDURE:  Patient is a 43 y.o. female who presented to outpatient surgical office with Left breast pain and erythema/swelling, for which patient was started on oral antibiotics and underwent in-office needle aspiration of her small Left breast abscesses x 2 using ultrasound guidance. Unfortunately, abscesses recurred and worsened >1 week following completion of prescribed antibiotics. All risks, benefits, and alternatives to incision and drainage of Left breast abscess were discussed with the patient, all of patient's questions were answered to her expressed satisfaction, and informed consent was obtained and documented.  DETAILS OF PROCEDURE: Patient was brought to the operating suite and appropriately identified. General anesthesia was administered along with appropriate pre-operative antibiotics, and LMA was placed by anesthetist. In supine position, operative site was prepped and draped in the usual sterile fashion, and following a brief  time out, the focus of maximal fluctuance was identified and a 2 cm incision was medial and parallel to circumareolar line using a #11 blade scalpel, upon which >5 mL of tan purulent fluid was immediately released under significant pressure and deep abscess culture was obtained. Intra-abscess septations/loculations were then disrupted using a hemostat, and an additional of 10 mL purulent fluid was expressed from 2 separate adjacent foci.   The abscess cavity was then copiously irrigated using warm sterile saline and suctioned. A dry gauze was inserted and removed, after which two tails of a 1/4" penrose drain was advanced into the former abscess cavities to promote drainage and to prevent closure of the skin over the drained abscess cavities, and the drain was secured using a 3-0 nylon suture, after which the drain was cut in its middle, creating essentially 1 limb of the drain into each abscess cavity. Surrounding skin was then cleaned and dried, and a sterile dry gauze and adhesive dressing was applied. Patient was then safely able to be extubated, awakened, and transferred to PACU for post-operative monitoring and care.  I was present for all aspects of the above procedure, and no operative complications were apparent.

## 2019-03-13 ENCOUNTER — Encounter: Payer: Self-pay | Admitting: Surgery

## 2019-03-14 ENCOUNTER — Ambulatory Visit: Payer: Self-pay | Admitting: Surgery

## 2019-03-15 ENCOUNTER — Ambulatory Visit: Payer: Self-pay | Admitting: Surgery

## 2019-03-16 LAB — AEROBIC/ANAEROBIC CULTURE W GRAM STAIN (SURGICAL/DEEP WOUND): Culture: NO GROWTH

## 2019-03-16 LAB — AEROBIC/ANAEROBIC CULTURE (SURGICAL/DEEP WOUND)

## 2019-03-18 ENCOUNTER — Telehealth: Payer: Self-pay | Admitting: Surgery

## 2019-03-18 NOTE — Telephone Encounter (Signed)
Called pt at the request of Dr. Maia Plan. She had I/D and penrose placed by Dr. Earlene Plater. She is doing well overall but has persistent drainage. D/W the pt that this was normal and she will continue to drain. Pt to change gauze BID and keep a/bs and appt for later this week

## 2019-03-21 ENCOUNTER — Other Ambulatory Visit: Payer: Self-pay

## 2019-03-21 ENCOUNTER — Ambulatory Visit (INDEPENDENT_AMBULATORY_CARE_PROVIDER_SITE_OTHER): Payer: Self-pay | Admitting: Surgery

## 2019-03-21 ENCOUNTER — Encounter: Payer: Self-pay | Admitting: Surgery

## 2019-03-21 VITALS — BP 104/72 | HR 64 | Temp 97.9°F | Ht 62.0 in | Wt 141.2 lb

## 2019-03-21 DIAGNOSIS — Z4889 Encounter for other specified surgical aftercare: Secondary | ICD-10-CM

## 2019-03-21 DIAGNOSIS — N611 Abscess of the breast and nipple: Secondary | ICD-10-CM

## 2019-03-21 NOTE — Patient Instructions (Addendum)
Patient will need to return to the office in 1 week. You can take showers no baths and change the dressings daily if you have more than normal drainage please change the dressing more frequently.    Call the office with any questions or concerns.

## 2019-03-21 NOTE — Progress Notes (Signed)
Surgical Clinic Progress/Follow-up Note   HPI:  43 y.o. Female presents to clinic for post-op follow-up 10 Days s/p incision and drainage of Left breast abscess Earlene Plater, 03/12/2019) refractory to ultrasound-guided needle aspiration. Via in-person certified Spanish-English translator, patient reports improved pre-operative pressure, though has continued to experience "different" pain at her surgical site that remains stable since surgery, since which she has also experienced clear "watery" and dark bloody fluid from her surgically placed Penrose drain without any thick or creamy-appearing drainage. She recalls she has been changing her gauze dressing twice daily as instructed. Of note, patient has only one dose remaining of prescribed antibiotics and expresses anxiety about completing antibiotics. She otherwise denies N/V, fever/chills, CP, or SOB.  Review of Systems:  Constitutional: denies fever/chills  Respiratory: denies shortness of breath, wheezing  Cardiovascular: denies chest pain, palpitations  Breast: pain, redness, and drainage as per interval history Skin: Denies any other rashes or skin discolorations except post-surgical wounds as per interval history  Vital Signs:  BP 104/72   Pulse 64   Temp 97.9 F (36.6 C) (Temporal)   Ht 5\' 2"  (1.575 m)   Wt 141 lb 3.2 oz (64 kg)   SpO2 98%   BMI 25.83 kg/m    Physical Exam:  Constitutional:  -- Normal body habitus  -- Awake, alert, and oriented x3  Pulmonary:  -- No crackles -- Equal breath sounds bilaterally -- Breathing non-labored at rest Cardiovascular:  -- S1, S2 present  -- No pericardial rubs  Breast:  -- Left breast Penrose drain remains well-secured with small amount of thin serosanguinous non-purulent fluid able to be expressed -- Medial firm tender peri-incisional subcutaneous hematoma without erythema -- No fluctuance, nipple discharge, or other mass(es) appreciated Musculoskeletal / Integumentary:  -- Wounds or  skin discoloration: None appreciated except post-surgical incisions as described above (Breast) -- Extremities: B/L UE and LE FROM, hands and feet warm, no edema   Surgically Obtained Deep Abscess/Fluid Culture (03/12/2019):  Gram Stain ABUNDANT WBC PRESENT,BOTH PMN AND MONONUCLEAR  NO ORGANISMS SEEN   Culture No growth aerobically or anaerobically.  Performed at [facility], [address], [place], [place] [address]   Report Status 03/16/2019 FINAL    Imaging: No new pertinent imaging available for review   Assessment:  43 y.o. yo Female with a problem list including...  Patient Active Problem List   Diagnosis Date Noted  . Left breast abscess 02/28/2019    presents to clinic for post-op follow-up evaluation, doing overall well 10 Days s/p incision and drainage of Left breast abscess Earlene Plater, 03/12/2019) refractory to ultrasound-guided needle aspiration.  Plan:              - Penrose drain removed uneventfully  - complete prescribed course of antibiotics  - change dry gauze dressing at least once daily + prn             - okay to shower, but do not submerge incisions under water (baths, swimming) until wound has healed             - return to clinic next week to reassess pain, hematoma, and for any signs of recurrent infection  - instructed to call office if any questions or concerns  All of the above recommendations were discussed with the patient, and all of patient's questions were answered to her expressed satisfaction.  -- Scherrie Gerlach Earlene Plater, MD, RPVI [place]: [facility] - Partnering for exceptional care. Office: [phone]

## 2019-03-28 ENCOUNTER — Other Ambulatory Visit: Payer: Self-pay

## 2019-03-28 ENCOUNTER — Encounter: Payer: Self-pay | Admitting: Surgery

## 2019-03-28 ENCOUNTER — Ambulatory Visit (INDEPENDENT_AMBULATORY_CARE_PROVIDER_SITE_OTHER): Payer: Self-pay | Admitting: Surgery

## 2019-03-28 VITALS — BP 124/70 | Temp 97.5°F | Ht 62.0 in | Wt 142.0 lb

## 2019-03-28 DIAGNOSIS — N611 Abscess of the breast and nipple: Secondary | ICD-10-CM

## 2019-03-28 DIAGNOSIS — Z4889 Encounter for other specified surgical aftercare: Secondary | ICD-10-CM

## 2019-03-28 NOTE — Progress Notes (Signed)
Surgical Clinic Progress/Follow-up Note   HPI:  43 y.o. Female presents to clinic for subsequent post-op follow-up 17 Days s/p incision and drainage of Left breast abscess refractory to ultrasound-guided needle aspiration Earlene Plater, 03/12/2019). Patient reports she completed her prescribed course of oral antibiotics following surgery. She describes initially thin watery drainage with "some blood", which resolved with more recently a small amount of "yellow" watery drainage 2 days ago. She denies any further redness, pus, or fever/chills, but expresses anxiety regarding the firmness around her incision site and possibility of future recurrence.  Review of Systems:  Constitutional: denies fever/chills  Respiratory: denies shortness of breath, wheezing  Cardiovascular: denies chest pain, palpitations  Breast: pain, erythema, and drainage as per interval history Skin: Denies any other rashes or skin discolorations except post-surgical wounds as per interval history  Vital Signs:  BP 124/70   Temp (!) 97.5 F (36.4 C) (Skin)   Ht 5\' 2"  (1.575 m)   Wt 142 lb (64.4 kg)   SpO2 99%   BMI 25.97 kg/m    Physical Exam:  Constitutional:  -- Normal body habitus  -- Awake, alert, and oriented x3  Pulmonary:  -- No crackles -- Equal breath sounds bilaterally -- Breathing non-labored at rest Cardiovascular:  -- S1, S2 present  -- No pericardial rubs  Breast:  -- Firm moderately tender to palpation scar tissue +/- resolving parenchymal hematoma without surrounding erythema, ecchymosis, fluctuance or drainage able to be expressed -- ~1 cm x 1 cm open superficial Left medial breast wound with pink healthy viable appearing granulation tissue -- No Right breast mass or Left breast mass otherwise, bilaterally no palpable axillary lymphadenopathy Musculoskeletal / Integumentary:  -- Wounds or skin discoloration: None appreciated except post-surgical incisions as described above (Breast) --  Extremities: B/L UE and LE FROM, hands and feet warm, no edema   Imaging: No new pertinent imaging available for review   Assessment:  43 y.o. yo Female with a problem list including...  Patient Active Problem List   Diagnosis Date Noted  . Breast abscess 02/28/2019    presents to clinic for post-op follow-up evaluation, doing overall well within anticipated parameters 17 Days s/p incision and drainage of Left breast abscess refractory to ultrasound-guided needle aspiration Earlene Plater, 03/12/2019) without any further evidence to suggest ongoing/persistent or recurrent Left breast abscess at this time.  Plan:   - discussed anticipated normal healing process             - okay to shower with soap and water, but do not submerge incisions under water (baths, swimming) until skin/wound has completely healed             - return to clinic once more in 2 weeks to confirm resolution of Left breast abscesses and appropriate healing  - instructed to call office if any questions or concerns  All of the above recommendations were discussed with the patient, and all of patient's questions were answered to her expressed satisfaction via Spanish-English translator.  -- Scherrie Gerlach Earlene Plater, MD, RPVI [place]:  [facility] - Partnering for exceptional care. Office: [phone]

## 2019-03-28 NOTE — Patient Instructions (Addendum)
Return to in two weeks.  The patient is aware to use a heating pad as needed for comfort. The patient is aware to call back for any questions or concerns.

## 2019-03-29 ENCOUNTER — Encounter: Payer: Self-pay | Admitting: Surgery

## 2019-04-11 ENCOUNTER — Ambulatory Visit (INDEPENDENT_AMBULATORY_CARE_PROVIDER_SITE_OTHER): Payer: Self-pay | Admitting: Surgery

## 2019-04-11 ENCOUNTER — Other Ambulatory Visit: Payer: Self-pay

## 2019-04-11 ENCOUNTER — Encounter: Payer: Self-pay | Admitting: Surgery

## 2019-04-11 VITALS — BP 109/75 | HR 64 | Temp 97.7°F | Resp 18 | Ht 62.0 in | Wt 144.8 lb

## 2019-04-11 DIAGNOSIS — Z4889 Encounter for other specified surgical aftercare: Secondary | ICD-10-CM

## 2019-04-11 DIAGNOSIS — N611 Abscess of the breast and nipple: Secondary | ICD-10-CM

## 2019-04-11 NOTE — Progress Notes (Signed)
Surgical Clinic Progress/Follow-up Note   HPI:  43 y.o. Female presents to clinic for post-op follow-up 1 month s/p incision and drainage Earlene Plater, 03/12/2019) of her Left breast abscess refractory to ultrasound-guided needle aspiration. Patient reports her Left breast remains "sore" and "hard", but upon further questioning, patient reports it is softer in areas than previously, and she denies any redness, or fever/chills with occasional small amount of clear - yellow drainage from a small opening at her former drain site. She completed her post-surgical antibiotics ~3 weeks ago.  Review of Systems:  Constitutional: denies fever/chills  Respiratory: denies shortness of breath, wheezing  Cardiovascular: denies chest pain, palpitations  Breast: pain, erythema, swelling, and drainage as per interval history Skin: Denies any other rashes or skin discolorations except post-surgical wounds as per interval history  Vital Signs:  BP 109/75   Pulse 64   Temp 97.7 F (36.5 C) (Temporal)   Resp 18   Ht 5\' 2"  (1.575 m)   Wt 144 lb 12.8 oz (65.7 kg)   LMP 04/08/2019   SpO2 99%   BMI 26.48 kg/m    Physical Exam:  Constitutional:  -- Overweight body habitus  -- Awake, alert, and oriented x3  Pulmonary:  -- No crackles -- Equal breath sounds bilaterally -- Breathing non-labored at rest Cardiovascular:  -- S1, S2 present  -- No pericardial rubs  Breast: -- 1 - 2 mm punctate opening at former drain site with tiny amounts of expressable clear tan slightly fatty fluid, slightly softer and smaller area of moderately tender to palpation peri-incisional induration with a central overlying area of hyperpigmentation without erythema, fluctuance, or purulent drainage -- No other Left breast mass(es) or nipple discharge appreciated Musculoskeletal / Integumentary:  -- Wounds or skin discoloration: None appreciated except post-surgical incisions as described above (Breast) -- Extremities: B/L UE and LE  FROM, hands and feet warm, no edema   Imaging: No new pertinent imaging available for review   Assessment:  43 y.o. yo Female with a problem list including...  Patient Active Problem List   Diagnosis Date Noted  . Left breast abscess 02/28/2019    presents to clinic for post-op follow-up evaluation, doing overall well within anticipated parameters 1 month s/p incision and drainage Earlene Plater, 03/12/2019) of her Left breast abscess refractory to ultrasound-guided needle aspiration without any further evidence to suggest ongoing/persistent or recurrent Left breast abscess at this time.  Plan:              - again discussed anticipated healing process - okay to shower with soap and water, but do not submerge incisions under water (baths, swimming) until skin/wound has completely healed - return to clinic once more in 1 month to confirm resolution of Left breast abscesses and appropriate healing             - instructed to call office if any questions or concerns  All of the above recommendations were discussed with the patient, and all of patient's questions were answered to her expressed satisfaction via Spanish-English translator.  -- Scherrie Gerlach Earlene Plater, MD, RPVI [place]: [facility] - Partnering for exceptional care. Office: [phone]

## 2019-04-11 NOTE — Patient Instructions (Addendum)
Please see your follow up appointment listed below.  °

## 2019-05-09 ENCOUNTER — Encounter: Payer: Self-pay | Admitting: Surgery

## 2019-05-09 ENCOUNTER — Other Ambulatory Visit: Payer: Self-pay

## 2019-05-09 ENCOUNTER — Ambulatory Visit (INDEPENDENT_AMBULATORY_CARE_PROVIDER_SITE_OTHER): Payer: Self-pay | Admitting: Surgery

## 2019-05-09 VITALS — BP 108/74 | HR 62 | Temp 97.9°F | Ht 62.0 in | Wt 143.0 lb

## 2019-05-09 DIAGNOSIS — Z4889 Encounter for other specified surgical aftercare: Secondary | ICD-10-CM

## 2019-05-09 DIAGNOSIS — N611 Abscess of the breast and nipple: Secondary | ICD-10-CM

## 2019-05-09 NOTE — Patient Instructions (Addendum)
The patient is aware to call back for any questions or new concerns.  May use a small gauze over the open area of the breast to collect any drainage as needed

## 2019-05-09 NOTE — Progress Notes (Signed)
Surgical Clinic Progress/Follow-up Note   HPI:  43 y.o. Female presents to clinic for follow-up evaluation of her Left media breast wound following incision and drainage of her Left breast abscess refractory to ultrasound-guided needle aspiration. Patient reports for the first time since her surgery that she feels better. She does, however, describe her Left breast remains firm surrounding her former incision and drainage incision site with improved, but not yet completely resolved, soreness, particularly along superior aspect. She also reports occasional small amounts of thin clear watery drainage every few days, but no further redness, bruising, or pus. She otherwise denies any fever/chills, bleeding, N/V, CP, or SOB.  Review of Systems:  Constitutional: denies any other weight loss, fever, chills, or sweats  Eyes: denies any other vision changes, history of eye injury  ENT: denies sore throat, hearing problems  Respiratory: denies shortness of breath, wheezing  Cardiovascular: denies chest pain, palpitations  Gastrointestinal: denies abdominal pain, N/V, or diarrhea Musculoskeletal: denies any other joint pains or cramps  Skin: Denies any other rashes or skin discolorations except as per interval history Neurological: denies any other headache, dizziness, weakness  Psychiatric: denies any other depression, anxiety  All other review of systems: otherwise negative   Vital Signs:  BP 108/74   Pulse 62   Temp 97.9 F (36.6 C) (Temporal)   Ht 5\' 2"  (1.575 m)   Wt 143 lb (64.9 kg)   LMP 05/05/2019   SpO2 99%   BMI 26.16 kg/m    Physical Exam:  Constitutional:  -- Normal body habitus  -- Awake, alert, and oriented x3  Eyes:  -- Pupils equally round and reactive to light  -- No scleral icterus  Ear, nose, throat:  -- No jugular venous distension  -- No nasal drainage, bleeding Pulmonary:  -- No crackles -- Equal breath sounds bilaterally -- Breathing non-labored at  rest Cardiovascular:  -- S1, S2 present  -- No pericardial rubs  Breast: -- Left medial breast sub-centimeter open residual wound with ~3 mL of scant thin clear fluid expressed and firm immobile scar vs resolving former hematoma, improved moderate peri-incisional tenderness to palpation, no surrounding erythema, ecchymosis, or purulent drainage -- No Left nipple discharge or masses otherwise Musculoskeletal / Integumentary:  -- Wounds or skin discoloration: None appreciated except as described above (Breast)  -- Extremities: B/L UE and LE FROM, hands and feet warm, no edema  Neurologic:  -- Motor function: intact and symmetric  -- Sensation: intact and symmetric   Imaging: No new pertinent imaging studies available for review at this time   Assessment:  43 y.o. yo Female with a problem list including...  Patient Active Problem List   Diagnosis Date Noted  . Left breast abscess 02/28/2019    presents to clinic for follow-up evaluation of her Left medial breast wound, doing better nearly 2 months s/p incision and drainage of her Left breast abscess Earlene Plater, 03/12/2019) refractory to ultrasound-guided needle aspiration.  Plan:   - no indication for antibiotics at this time  - dry gauze as needed to prevent staining of clothing, may be held in place by bra or tape  - okay to continue showering, though do not submerge open wound under water until healed  - additional routine scheduled follow-up appointment offered, though patient expresses preference to call if not continuing to improve as described and discussed  - return to clinic as needed, instructed to call office if any questions or concerns  All of the above recommendations were discussed with  the patient via in-person certified medical Spanish-English translation service, and all of patient's questions were answered to her expressed satisfaction.  -- Scherrie GerlachJason E. Earlene Plateravis, MD, RPVI [place]: [facility] - Partnering for exceptional care. Office: [phone]

## 2019-06-03 ENCOUNTER — Other Ambulatory Visit: Payer: Self-pay | Admitting: *Deleted

## 2019-06-03 DIAGNOSIS — N611 Abscess of the breast and nipple: Secondary | ICD-10-CM

## 2019-06-03 NOTE — Progress Notes (Signed)
Alyse Low called and state patient is having nipple discharge. Order a bilateral mammogram and left ultrasound.

## 2019-06-06 ENCOUNTER — Ambulatory Visit (INDEPENDENT_AMBULATORY_CARE_PROVIDER_SITE_OTHER): Payer: Self-pay

## 2019-06-06 ENCOUNTER — Ambulatory Visit: Payer: Self-pay

## 2019-06-06 ENCOUNTER — Encounter: Payer: Self-pay | Admitting: Surgery

## 2019-06-06 ENCOUNTER — Other Ambulatory Visit: Payer: Self-pay

## 2019-06-06 ENCOUNTER — Ambulatory Visit (INDEPENDENT_AMBULATORY_CARE_PROVIDER_SITE_OTHER): Payer: Self-pay | Admitting: Surgery

## 2019-06-06 VITALS — BP 124/85 | HR 74 | Temp 97.5°F | Resp 16 | Ht 62.0 in | Wt 145.2 lb

## 2019-06-06 DIAGNOSIS — N611 Abscess of the breast and nipple: Secondary | ICD-10-CM

## 2019-06-06 MED ORDER — SULFAMETHOXAZOLE-TRIMETHOPRIM 800-160 MG PO TABS: 1.0000 | ORAL_TABLET | Freq: Two times a day (BID) | ORAL | 0 refills | Status: DC

## 2019-06-06 NOTE — Patient Instructions (Addendum)
The patient is aware to call back for any questions or new concerns. Keep area clean Change dressing as needed May shower

## 2019-06-06 NOTE — Progress Notes (Signed)
Surgical Clinic Progress/Follow-up Note   HPI:  43 y.o. Female presents to clinic 3 months following incision and drainage of complex/loculated Left breast abscess for evaluation of recurrent Left breast pain. Patient reports she has continued to feel consistently better without any further breast pain, swelling, drainage, or bruising until 4 days ago, at which time she says she suddenly again began to experience Left medial breast pain. She was evaluated by [facility] for this, at which time fluid was reportedly expressed and sent for culture, until which results no antibiotics were prescribed. Patient says she has also over the past few days began to notice a focus of mildly sore blush inferior to her Right nipple that she recalls is similar to how her Left breast abscess began. Patient otherwise denies any recent breast trauma, fever/chills, nipple discharge, or palpable mass(es).  Of note, patient was ordered a B/L diagnostic mammogram and focused ultrasound after our office was told patient was experiencing B/L nipple discharge. Patient today denies this and says she attempted to have mammogram and ultrasound performed, but was told she was late when she arrived at 11:15 am for what she thought was an 11:30 am appointment, but was actually scheduled for 11 am.  Review of Systems:  Constitutional: denies any other weight loss, fever, chills, or sweats  Eyes: denies any other vision changes, history of eye injury  ENT: denies sore throat, hearing problems  Respiratory: denies shortness of breath, wheezing  Cardiovascular: denies chest pain, palpitations  Breasts: pain, erythema, bruising, mass(es), and nipple discharge as per interval history Gastrointestinal: denies abdominal pain, N/V, or diarrhea Musculoskeletal: denies any other joint pains or cramps  Skin: Denies any other rashes or skin discolorations  Neurological: denies any other headache, dizziness, weakness  Psychiatric:  denies any other depression, anxiety  All other review of systems: otherwise negative   Vital Signs:  BP 124/85   Pulse 74   Temp (!) 97.5 F (36.4 C) (Temporal)   Resp 16   Ht 5\' 2"  (1.575 m)   Wt 145 lb 3.2 oz (65.9 kg)   SpO2 98%   BMI 26.56 kg/m    Physical Exam:  Constitutional:  -- Normal body habitus  -- Awake, alert, and oriented x3  Eyes:  -- Pupils equally round and reactive to light  -- No scleral icterus  Ear, nose, throat:  -- No jugular venous distension  -- No nasal drainage, bleeding Pulmonary:  -- No crackles -- Equal breath sounds bilaterally -- Breathing non-labored at rest Cardiovascular:  -- S1, S2 present  -- No pericardial rubs  Breasts: -- Tender to palpation Left medial breast fluctuance from 8 o'clock to 10 o'clock position with mild surrounding erythema and 3 mm focus of fibrinous exudate without any drainage at inferior 8 o'clock aspect and expression of thin clear slightly yellow non-purulent fluid at former 10 o'clock incision site (suggestive of liquefied fat necrosis) -- Mildly tender to palpation faint triangular-shaped erythematous blush immediately inferior to Right nipple-areola -- No appreciable masses or nipple discharge bilaterally Gastrointestinal:  -- Soft, nontender, non-distended, no guarding/rebound  -- No abdominal masses appreciated, pulsatile or otherwise  Musculoskeletal / Integumentary:  -- Wounds or skin discoloration: None appreciated except as described above (Breasts)  -- Extremities: B/L UE and LE FROM, hands and feet warm, no edema  Neurologic:  -- Motor function: intact and symmetric  -- Sensation: intact and symmetric   Laboratory studies:  CBC Latest Ref Rng & Units 02/16/2019  WBC 4.0 -  10.5 K/uL 9.2  Hemoglobin 12.0 - 15.0 g/dL 14.1  Hematocrit 36.0 - 46.0 % 40.9  Platelets 150 - 400 K/uL 590(H)   Imaging:  In-Office Limited Left Breast Ultrasound Rosana Hoes, 06/06/2019) - personally performed and interpreted  with images saved and printed Complex multilobular communicating Left medial breast fluid collection (including 3 separate, but communicating, channels), concerning for abscess vs liquefied hematoma, terminating most superficially at the site of patient's 9 o'clock palpable fluctuance and slightly superior to 8 o'clock 3 mm focus of fibrinous exudate  Assessment:  43 y.o. yo Female with a problem list including...  There are no active problems to display for this patient.   presents to clinic for follow-up evaluation of recurrent Left breast complex communicating multilobular fluid collection, 3 months s/p incision and drainage for Left breast abscess not relieved with percutaneous aspiration.    Plan:   - ultrasound findings reviewed and discussed with patient  - all risks, benefits, and alternatives to ultrasound-guided needle aspiration and subsequent incision and drainage for recurrent Left breast fluid collection, concerning for abscess vs liquefied hematoma, were discussed with the patient, all of her questions were answered to her expressed satisfaction, patient expresses she wishes to proceed, and informed consent was obtained.  - Left breast ultrasound-guided needle aspiration attempted, but progressed to in-office incision and drainage due to no fluid able to be aspirated  - follow up culture of non-purulent fluid, will meanwhile initiate prescription for Bactrim considering patient's surrounding erythema and history  - return to clinic next week to reassess, instructed to call office if any questions or concerns  All of the above recommendations were discussed with the patient, and all of patient's questions were answered to her expressed satisfaction via in-person certified medical Spanish-English translation service.  -- Marilynne Drivers Rosana Hoes, MD, [place]: [facility] - Partnering for exceptional care. Office: [phone]

## 2019-06-06 NOTE — Procedures (Signed)
SURGICAL OPERATIVE REPORT   DATE OF PROCEDURE: 06/06/2019  ATTENDING: Corene Cornea E. Rosana Hoes, MD  ANESTHESIA: Local  PRE-OPERATIVE DIAGNOSIS: Recurrent Left breast abscess (icd-10's: N61.1)   POST-OPERATIVE DIAGNOSIS: Left breast hematoma (icd-10's: N61.1)   PROCEDURE(S):  1.) Incision and drainage of complex/loculated Left breast abscess/hematoma (cpt: 19020)   INTRAOPERATIVE FINDINGS: ~25 mL of dark old blood and clot/tissue from Left breast fluid collections with immediate relief of "pressure" and subsequently improved pain upon completion of procedure, much improved erythema and resolved swelling upon drainage   INTRAVENOUS FLUIDS: 0 mL crystalloid    ESTIMATED BLOOD LOSS: Minimal (<20 mL)    URINE OUTPUT: No Foley catheter    SPECIMENS: Contents of Left breast abscess/hematoma for culture   IMPLANTS: None   DRAINS: None   COMPLICATIONS: None apparent   CONDITION AT END OF PROCEDURE: Hemodynamically stable and awake   DISPOSITION OF PATIENT: Home   INDICATIONS FOR PROCEDURE:  Patient is a 43 y.o. female 3 months s/p incision and drainage of Left breast abscess who was recovering well and consistently feeling better when she 4 days ago suddenly redeveloped acute onset of recurrent Left medial breast pain and swelling, for which she presented to [facility] and was referred again for surgical evaluation. In-office focal ultrasound was performed, and ultrasound-guided needle aspiration, possible incision and drainage, were recommended. All risks, benefits, and alternatives to ultrasound-guided needle aspiration with possible incision and drainage of Left breast complex multi-lobulated fluid collection were discussed with the patient, all of patient's questions were answered to her expressed satisfaction, and informed consent was obtained and documented.   DETAILS OF PROCEDURE: Patient was appropriately identified, and in supine  position, procedural site was prepped in the usual fashion, and following a brief time out, local anesthetic was injected, after which 20G needle was directed into the fluid collection and aspirated x 3 without any recovery of fluid, purulent or otherwise. Accordingly, decision was made to proceed to incision and drainage. The focus of maximal fluctuance was identified, and a 1 cm incision was made using a #11 blade scalpel, upon which ~25 total mL of non-purulent dark old-appearing blood intermixed with a few clots and tissue fragments was evacuated and deep fluid culture was obtained. Additional fluid was then expressed, and septations/loculations were disrupted using a hemostat. A second 1 cm incision was made to re-open the prior incision and drainage site via which the small amount of liquefied fat necrosis was pre-procedurally able to be expressed, but again no purulent fluid was obtained.  Additional local anesthetic was injected for improved pain control, surrounding skin was cleaned and dried, and a sterile dry gauze and adhesive dressing was applied. Patient expressed immediate relief of pressure during procedure and relief from pain upon completion of procedure.   I was present for all aspects of the above procedure, and no operative complications were apparent.

## 2019-06-07 ENCOUNTER — Encounter: Payer: Self-pay | Admitting: Surgery

## 2019-06-11 LAB — ANAEROBIC AND AEROBIC CULTURE

## 2019-06-13 ENCOUNTER — Encounter: Payer: Self-pay | Admitting: Surgery

## 2019-06-13 ENCOUNTER — Other Ambulatory Visit: Payer: Self-pay

## 2019-06-13 ENCOUNTER — Ambulatory Visit (INDEPENDENT_AMBULATORY_CARE_PROVIDER_SITE_OTHER): Payer: Self-pay | Admitting: Surgery

## 2019-06-13 VITALS — BP 128/74 | HR 82 | Temp 97.7°F | Ht 62.0 in | Wt 143.0 lb

## 2019-06-13 DIAGNOSIS — N611 Abscess of the breast and nipple: Secondary | ICD-10-CM

## 2019-06-13 DIAGNOSIS — Z4889 Encounter for other specified surgical aftercare: Secondary | ICD-10-CM

## 2019-06-13 NOTE — Progress Notes (Signed)
Surgical Clinic Progress/Follow-up Note   HPI:  43 y.o. Female presents to clinic for follow-up 1 week s/p evacuation of recurrent multilobular Left breast fluid collection Rosana Hoes, 06/06/2019) and 3 months s/p incision and drainage Rosana Hoes, 03/12/2019) for Left breast abscess not relieved with percutaneous aspiration (though cultures negative for any growth). Patient reports via certified medical Spanish-English translator that she feels "better", though her Left medial breast soreness has not yet resolved completely. She otherwise has been taking antibiotics as prescribed and denies redness, drainage, nipple discharge, fever/chills, CP, or SOB.  Review of Systems:  Constitutional: denies fever/chills  Respiratory: denies shortness of breath, wheezing  Cardiovascular: denies chest pain, palpitations  Breast: pain, erythema, drainage, and nipple discharge as per interval history Skin: Denies any other rashes or skin discolorations except post-surgical wounds as per interval history  Vital Signs:  BP 128/74   Pulse 82   Temp 97.7 F (36.5 C)   Ht 5\' 2"  (1.575 m)   Wt 143 lb (64.9 kg)   LMP 06/12/2019   SpO2 97%   BMI 26.16 kg/m    Physical Exam:  Constitutional:  -- Normal body habitus  -- Awake, alert, and oriented x3  Pulmonary:  -- No crackles -- Equal breath sounds bilaterally -- Breathing non-labored at rest Cardiovascular:  -- S1, S2 present  -- No pericardial rubs  Breast: -- Moderately tender to palpation Left medial breast induration without any peri-incisional erythema or drainage, purulent or otherwise -- No other appreciable Left breast or axillary mass(es) or nipple discharge  Musculoskeletal / Integumentary:  -- Wounds or skin discoloration: None appreciated except post-surgical incisions as described above (Breast) -- Extremities: B/L UE and LE FROM, hands and feet warm, no edema   Laboratory/Microbiology studies: Left Breast Fluid Culture (06/06/2019) No  aerobic growth in 36 - 48 hours or anaerobic growth in 72 hours   Assessment:  43 y.o. yo Female with a problem list including...  There are no active problems to display for this patient.   presents to clinic for post-op follow-up evaluation, doing overall well 1 week s/p evacuation of recurrent multilobular Left breast fluid collection Rosana Hoes, 06/06/2019) and 3 months s/p incision and drainage Rosana Hoes, 03/12/2019) for Left breast abscess not relieved with percutaneous aspiration (though cultures negative for any growth).  Plan:              - complete antibiotics as prescribed  - acetaminophen / NSAID prn for pain relief             - change dry gauze dressing once daily if drainage             - okay to shower, but do not submerge incision under water until healed  - instructed to call office if any questions or concerns             - return to clinic in 2 weeks  All of the above recommendations were discussed with the patient, and all of patient's questions were answered to her expressed satisfaction.  -- Marilynne Drivers Rosana Hoes, MD, [place]: [facility] Partnering for exceptional care. Office: [phone]

## 2019-06-13 NOTE — Patient Instructions (Signed)
Please finish the antibiotics. Please see your follow up appointment listed below.

## 2019-06-27 ENCOUNTER — Ambulatory Visit (INDEPENDENT_AMBULATORY_CARE_PROVIDER_SITE_OTHER): Payer: Self-pay | Admitting: Physician Assistant

## 2019-06-27 ENCOUNTER — Encounter: Payer: Self-pay | Admitting: Physician Assistant

## 2019-06-27 ENCOUNTER — Other Ambulatory Visit: Payer: Self-pay

## 2019-06-27 VITALS — BP 112/74 | HR 64 | Temp 97.7°F | Resp 16 | Ht 62.0 in | Wt 146.8 lb

## 2019-06-27 DIAGNOSIS — N611 Abscess of the breast and nipple: Secondary | ICD-10-CM

## 2019-06-27 MED ORDER — IBUPROFEN 600 MG PO TABS: 600.0000 mg | ORAL_TABLET | Freq: Four times a day (QID) | ORAL | 1 refills | Status: DC | PRN

## 2019-06-27 NOTE — Progress Notes (Signed)
[facility] SURGICAL CLINIC NOTE  06/27/2019  History of Present Illness: Tamara Downs is a 43 y.o. female with recurrent left breast abscesses. Patient reports a ~4 months history of recurrent left breast swelling and pain between the 8-9 o'clock positions near just medial to the areola. She underwent I&D in March 2020 and aspiration in June 2020 with Dr Earlene Plateravis for this. Cx results of both of these did not grow any organisms. She was seen most recently in June 2020 by Dr Earlene Plateravis and started on 2 weeks of bactrim following in office aspiration. Today, she reports that she is feeling better but still have soreness and discomfort in her left breast. This is worse with movement and has prevented her from working. She has not tried anything for the pain. She does not use compressive bras as the wires and stitching worsen the pain. She denied any fever or chills. No reports of drainage, nipple discharge, or nipple inversion. There has been no drainage since the aspiration. Again, no growth on cultures. She reports that these typically recur in about 2-4 weeks after completing antibiotics. No other complaints this morning.    Past Medical History: Past Medical History:  Diagnosis Date  . Left breast abscess 02/28/2019     Past Surgical History: Past Surgical History:  Procedure Laterality Date  . INCISION AND DRAINAGE ABSCESS Left 03/12/2019   Procedure: INCISION AND DRAINAGE LEFT BREAST ABSCESS;  Surgeon: Ancil Linseyavis, Jason Evan, MD;  Location: [facility] ORS;  Service: General;  Laterality: Left;    Home Medications: Prior to Admission medications   Medication Sig Start Date End Date Taking? Authorizing Provider  ibuprofen (ADVIL) 600 MG tablet Take 1 tablet (600 mg total) by mouth every 6 (six) hours as needed. 06/27/19   Donovan KailSchulz, Jayjay Littles R, PA-C    Allergies: No Known Allergies  Review of Systems: Review of Systems  Constitutional: Negative for chills, fever and weight loss.   HENT: Negative for congestion and sore throat.   Respiratory: Negative for cough and shortness of breath.   Cardiovascular: Negative for chest pain and palpitations.  Gastrointestinal: Negative for abdominal pain, nausea and vomiting.  Skin:       + Left Breast Pain  All other systems reviewed and are negative.   Physical Exam BP 112/74   Pulse 64   Temp 97.7 F (36.5 C) (Temporal)   Resp 16   Ht 5\' 2"  (1.575 m)   Wt 146 lb 12.8 oz (66.6 kg)   LMP 06/12/2019   SpO2 99%   BMI 26.85 kg/m  CONSTITUTIONAL: Well appearing female, NAD HEENT:  Normocephalic, atraumatic, extraocular motion intact. RESPIRATORY:  Lungs are clear, and breath sounds are equal bilaterally. Normal respiratory effort without pathologic use of accessory muscles. CARDIOVASCULAR: Heart is regular without murmurs, gallops, or rubs. BREAST:     Left: At the 9 o'clock position just medial to left areola there is palpable induration and well healed incision, no surrounding erythema, no palpable fluctuance, no active drainage. This area is tender to palpation At the 8 o'clock position to the areola there is similar palpable induration without fluctuance or drainage, tender to palpation. No further appreciable masses. No nipple discharge or inversion. No palpable axillary lymph nodes.    Right: No palp[able masses in the right breast, no skin changes, no nipple discharge or inversion, no palpable axillary lymph nodes.   NEUROLOGIC:  Motor and sensation is grossly normal.  Cranial nerves are grossly intact. PSYCH:  Alert and oriented  to person, place and time. Affect is normal.   Labs/Imaging: No pertinent imaging or labs related to this visit.    Assessment and Plan: This is a 43 y.o. female with recurrent left breast masses thought to be abscess although no organisms on culture x2.    - 600 mg Ibuprofen for pain and inflammation  - Continue warm compresses  - Recommend supportive bra as tolerates.   -  No  indication for procedural intervention currently, no palpable abscess, no active drainage  - Will follow up in 2 weeks to reassess  All of the patient's questions and concerns were addressed and answered.   This visit was aided by spanish-english medical interpreter.   -- Edison Simon, PA-C [facility] 06/27/2019, 10:18 AM [phone] M-F: 7am - 4pm

## 2019-06-27 NOTE — Patient Instructions (Addendum)
Take Motrin or Advil to see if this helps with the discomfort.  Please see your follow up appointment listed below.   Please try wearing a sports bra.

## 2019-06-28 ENCOUNTER — Encounter: Payer: Self-pay | Admitting: Surgery

## 2019-07-10 ENCOUNTER — Encounter: Payer: Self-pay | Admitting: Physician Assistant

## 2019-07-10 ENCOUNTER — Encounter: Payer: Self-pay | Admitting: *Deleted

## 2019-07-10 ENCOUNTER — Other Ambulatory Visit
Admission: RE | Admit: 2019-07-10 | Discharge: 2019-07-10 | Disposition: A | Discharge status: Home / Self Care | Payer: HRSA Program | Source: Ambulatory Visit | Attending: Surgery | Admitting: Surgery

## 2019-07-10 ENCOUNTER — Ambulatory Visit (INDEPENDENT_AMBULATORY_CARE_PROVIDER_SITE_OTHER): Payer: Self-pay | Admitting: Surgery

## 2019-07-10 ENCOUNTER — Inpatient Hospital Stay: Admission: RE | Admit: 2019-07-10 | Payer: Self-pay | Source: Ambulatory Visit

## 2019-07-10 ENCOUNTER — Other Ambulatory Visit: Payer: Self-pay

## 2019-07-10 VITALS — BP 126/88 | HR 60 | Temp 97.7°F | Ht 60.0 in | Wt 147.0 lb

## 2019-07-10 DIAGNOSIS — Z20828 Contact with and (suspected) exposure to other viral communicable diseases: Secondary | ICD-10-CM | POA: Insufficient documentation

## 2019-07-10 DIAGNOSIS — N611 Abscess of the breast and nipple: Secondary | ICD-10-CM

## 2019-07-10 LAB — SARS CORONAVIRUS 2 (TAT 6-24 HRS): SARS Coronavirus 2: NEGATIVE

## 2019-07-10 MED ORDER — ERYTHROMYCIN BASE 500 MG PO TABS: 500.0000 mg | ORAL_TABLET | Freq: Four times a day (QID) | ORAL | 0 refills | Status: DC

## 2019-07-10 NOTE — Progress Notes (Signed)
Outpatient Surgical Follow Up  07/10/2019  Tamara Downs is an 43 y.o. female.   Chief Complaint  Patient presents with  . Routine Post Op    left breast abscess    HPI: Tamara Downs is a 43-year-old female well-known to our practice with multiple I&D's of the left breast now since with persistent induration and drainage from her left breast.  She also reports moderate to severe pain in the left breast that is intermittent and worsening when wearing a bra.  Pain is sharp.  No fevers no chills.  There is significant inflammatory response on the left breast.  Cultures so far is from I&D spine aspirations failed to yield any organisms.  Started before on Bactrim and the last time she was seen by Mr. Schulz  PAC and a trial of conservative management to include compresses was attempted.  She reports that the antibiotics or the compresses did not work.  She denies any fevers or chills.   Past Medical History:  Diagnosis Date  . Left breast abscess 02/28/2019    Past Surgical History:  Procedure Laterality Date  . INCISION AND DRAINAGE ABSCESS Left 03/12/2019   Procedure: INCISION AND DRAINAGE LEFT BREAST ABSCESS;  Surgeon: Davis, Jason Evan, MD;  Location: [facility] ORS;  Service: General;  Laterality: Left;    Family History  Problem Relation Age of Onset  . BRCA 1/2 Neg Hx     Social History:  reports that she has never smoked. She has never used smokeless tobacco. She reports that she does not drink alcohol or use drugs.  Allergies: No Known Allergies  Medications reviewed.    ROS Full ROS performed and is otherwise negative other than what is stated in HPI   BP 126/88   Pulse 60   Temp 97.7 F (36.5 C) (Skin)   Ht 5' (1.524 m)   Wt 147 lb (66.7 kg)   LMP 06/12/2019   SpO2 98%   BMI 28.71 kg/m   Physical Exam Vitals signs and nursing note reviewed. Exam conducted with a chaperone present.  Constitutional:      General: She is not in acute distress.    Appearance:  Normal appearance. She is normal weight.  Eyes:     General:        Right eye: No discharge.        Left eye: No discharge.  Neck:     Musculoskeletal: Normal range of motion and neck supple. No neck rigidity.  Cardiovascular:     Rate and Rhythm: Normal rate and regular rhythm.     Heart sounds: No murmur.  Pulmonary:     Effort: Pulmonary effort is normal. No respiratory distress.     Breath sounds: Normal breath sounds. No stridor.     Comments: BREAST: Left breast w superficial wound w good granulation within the periareolar area at 9 o'clock. There is two area of fluctuance and exquisite tenderness inferior and superior to the wound. There is significant induration within the left breast to suggest chronic granulomatous process Abdominal:     General: Abdomen is flat. There is no distension.     Palpations: There is no mass.     Tenderness: There is no abdominal tenderness. There is no guarding or rebound.     Hernia: No hernia is present.  Skin:    Capillary Refill: Capillary refill takes less than 2 seconds.  Neurological:     General: No focal deficit present.     Mental Status:   She is alert and oriented to person, place, and time.  Psychiatric:        Mood and Affect: Mood normal.        Behavior: Behavior normal.        Thought Content: Thought content normal.        Judgment: Judgment normal.        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  Given that the exam is limited due to severe pain and there is fluctuance on exam I do recommend proceeding to the operating room for I&D of abscess under ultrasound guidance as well as debridement.  At that time we will be able to obtain a good biopsy confirmed the changes consistent with prior chronic granulomatous mastitis. Have discussed with the patient in detail about the procedure.  Risk benefit and possible complications including but not limited to:  Bleeding, infection, chronic pain, chronic and delayed wound healing.  She understands.  This is a chronic and ongoing process and the patient is ready to move on and to have a more definitive solution  Greater than 50% of the 25 minutes  visit was spent in counseling/coordination of care   Trapper Meech, MD FACS General Surgeon 

## 2019-07-10 NOTE — Patient Instructions (Addendum)
Patient to have left breast abscess at [facility]

## 2019-07-10 NOTE — Progress Notes (Signed)
Patient's surgery to be scheduled for 07-12-19 at [facility] with Dr. Dahlia Byes.   The patient is aware to have COVID-19 testing done on 07-10-19 at the [place] building drive thru ([address] [place]) after [employer]. She is aware to isolate after, have no visitors, wash hands frequently, and avoid touching face.   The patient is aware she will need to Pre-Admit on 07-10-19 at 10 am. Patient will check in at the [place] entrance due to COVID-19 restrictions and will then be escorted to the [place], [address] (first floor).

## 2019-07-10 NOTE — H&P (View-Only) (Signed)
Outpatient Surgical Follow Up  07/10/2019  Tamara Downs is an 43 y.o. female.   Chief Complaint  Patient presents with  . Routine Post Op    left breast abscess    HPI: Tamara Downs is a 43 year old female well-known to our practice with multiple I&D's of the left breast now since with persistent induration and drainage from her left breast.  She also reports moderate to severe pain in the left breast that is intermittent and worsening when wearing a bra.  Pain is sharp.  No fevers no chills.  There is significant inflammatory response on the left breast.  Cultures so far is from I&D spine aspirations failed to yield any organisms.  Started before on Bactrim and the last time she was seen by Mr. Olean Ree  [facility] and a trial of conservative management to include compresses was attempted.  She reports that the antibiotics or the compresses did not work.  She denies any fevers or chills.   Past Medical History:  Diagnosis Date  . Left breast abscess 02/28/2019    Past Surgical History:  Procedure Laterality Date  . INCISION AND DRAINAGE ABSCESS Left 03/12/2019   Procedure: INCISION AND DRAINAGE LEFT BREAST ABSCESS;  Surgeon: Vickie Epley, MD;  Location: [facility] ORS;  Service: General;  Laterality: Left;    Family History  Problem Relation Age of Onset  . BRCA 1/2 Neg Hx     Social History:  reports that she has never smoked. She has never used smokeless tobacco. She reports that she does not drink alcohol or use drugs.  Allergies: No Known Allergies  Medications reviewed.    ROS Full ROS performed and is otherwise negative other than what is stated in HPI   BP 126/88   Pulse 60   Temp 97.7 F (36.5 C) (Skin)   Ht 5' (1.524 m)   Wt 147 lb (66.7 kg)   LMP 06/12/2019   SpO2 98%   BMI 28.71 kg/m   Physical Exam Vitals signs and nursing note reviewed. Exam conducted with a chaperone present.  Constitutional:      General: She is not in acute distress.    Appearance:  Normal appearance. She is normal weight.  Eyes:     General:        Right eye: No discharge.        Left eye: No discharge.  Neck:     Musculoskeletal: Normal range of motion and neck supple. No neck rigidity.  Cardiovascular:     Rate and Rhythm: Normal rate and regular rhythm.     Heart sounds: No murmur.  Pulmonary:     Effort: Pulmonary effort is normal. No respiratory distress.     Breath sounds: Normal breath sounds. No stridor.     Comments: BREAST: Left breast w superficial wound w good granulation within the periareolar area at 9 o'clock. There is two area of fluctuance and exquisite tenderness inferior and superior to the wound. There is significant induration within the left breast to suggest chronic granulomatous process Abdominal:     General: Abdomen is flat. There is no distension.     Palpations: There is no mass.     Tenderness: There is no abdominal tenderness. There is no guarding or rebound.     Hernia: No hernia is present.  Skin:    Capillary Refill: Capillary refill takes less than 2 seconds.  Neurological:     General: No focal deficit present.     Mental Status:  She is alert and oriented to person, place, and time.  Psychiatric:        Mood and Affect: Mood normal.        Behavior: Behavior normal.        Thought Content: Thought content normal.        Judgment: Judgment normal.        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  Given that the exam is limited due to severe pain and there is fluctuance on exam I do recommend proceeding to the operating room for I&D of abscess under ultrasound guidance as well as debridement.  At that time we will be able to obtain a good biopsy confirmed the changes consistent with prior chronic granulomatous mastitis. Have discussed with the patient in detail about the procedure.  Risk benefit and possible complications including but not limited to:  Bleeding, infection, chronic pain, chronic and delayed wound healing.  She understands.  This is a chronic and ongoing process and the patient is ready to move on and to have a more definitive solution  Greater than 50% of the 25 minutes  visit was spent in counseling/coordination of care   Caroleen Hamman, MD [place] Surgeon

## 2019-07-10 NOTE — Progress Notes (Signed)
Per Caryl Pina with Pre-admit, [facility] would not let this patient in the hospital today to have office Pre-admit appointment due to having a child with her.   Caryl Pina has instructed patient via interpreter, Montey Hora, to have COVID testing done today and to arrive 2 hours prior to surgery on Friday.

## 2019-07-10 NOTE — H&P (View-Only) (Signed)
Outpatient Surgical Follow Up  07/10/2019  Tamara Downs is an 43 y.o. female.   Chief Complaint  Patient presents with  . Routine Post Op    left breast abscess    HPI: Tamara Downs is a 43-year-old female well-known to our practice with multiple I&D's of the left breast now since with persistent induration and drainage from her left breast.  She also reports moderate to severe pain in the left breast that is intermittent and worsening when wearing a bra.  Pain is sharp.  No fevers no chills.  There is significant inflammatory response on the left breast.  Cultures so far is from I&D spine aspirations failed to yield any organisms.  Started before on Bactrim and the last time she was seen by Mr. Schulz  PAC and a trial of conservative management to include compresses was attempted.  She reports that the antibiotics or the compresses did not work.  She denies any fevers or chills.   Past Medical History:  Diagnosis Date  . Left breast abscess 02/28/2019    Past Surgical History:  Procedure Laterality Date  . INCISION AND DRAINAGE ABSCESS Left 03/12/2019   Procedure: INCISION AND DRAINAGE LEFT BREAST ABSCESS;  Surgeon: Davis, Jason Evan, MD;  Location: [facility] ORS;  Service: General;  Laterality: Left;    Family History  Problem Relation Age of Onset  . BRCA 1/2 Neg Hx     Social History:  reports that she has never smoked. She has never used smokeless tobacco. She reports that she does not drink alcohol or use drugs.  Allergies: No Known Allergies  Medications reviewed.    ROS Full ROS performed and is otherwise negative other than what is stated in HPI   BP 126/88   Pulse 60   Temp 97.7 F (36.5 C) (Skin)   Ht 5' (1.524 m)   Wt 147 lb (66.7 kg)   LMP 06/12/2019   SpO2 98%   BMI 28.71 kg/m   Physical Exam Vitals signs and nursing note reviewed. Exam conducted with a chaperone present.  Constitutional:      General: She is not in acute distress.    Appearance:  Normal appearance. She is normal weight.  Eyes:     General:        Right eye: No discharge.        Left eye: No discharge.  Neck:     Musculoskeletal: Normal range of motion and neck supple. No neck rigidity.  Cardiovascular:     Rate and Rhythm: Normal rate and regular rhythm.     Heart sounds: No murmur.  Pulmonary:     Effort: Pulmonary effort is normal. No respiratory distress.     Breath sounds: Normal breath sounds. No stridor.     Comments: BREAST: Left breast w superficial wound w good granulation within the periareolar area at 9 o'clock. There is two area of fluctuance and exquisite tenderness inferior and superior to the wound. There is significant induration within the left breast to suggest chronic granulomatous process Abdominal:     General: Abdomen is flat. There is no distension.     Palpations: There is no mass.     Tenderness: There is no abdominal tenderness. There is no guarding or rebound.     Hernia: No hernia is present.  Skin:    Capillary Refill: Capillary refill takes less than 2 seconds.  Neurological:     General: No focal deficit present.     Mental Status:   She is alert and oriented to person, place, and time.  Psychiatric:        Mood and Affect: Mood normal.        Behavior: Behavior normal.        Thought Content: Thought content normal.        Judgment: Judgment normal.        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  Given that the exam is limited due to severe pain and there is fluctuance on exam I do recommend proceeding to the operating room for I&D of abscess under ultrasound guidance as well as debridement.  At that time we will be able to obtain a good biopsy confirmed the changes consistent with prior chronic granulomatous mastitis. Have discussed with the patient in detail about the procedure.  Risk benefit and possible complications including but not limited to:  Bleeding, infection, chronic pain, chronic and delayed wound healing.  She understands.  This is a chronic and ongoing process and the patient is ready to move on and to have a more definitive solution  Greater than 50% of the 25 minutes  visit was spent in counseling/coordination of care   Diego Pabon, MD FACS General Surgeon 

## 2019-07-11 ENCOUNTER — Other Ambulatory Visit: Payer: Self-pay | Admitting: *Deleted

## 2019-07-11 MED ORDER — CEFAZOLIN SODIUM-DEXTROSE 2-4 GM/100ML-% IV SOLN
2.0000 g | INTRAVENOUS | Status: AC
  Administered 2019-07-12: 2 g via INTRAVENOUS

## 2019-07-11 MED ORDER — ERYTHROMYCIN BASE 500 MG PO TBEC: 500.0000 mg | DELAYED_RELEASE_TABLET | Freq: Four times a day (QID) | ORAL | 0 refills | Status: DC

## 2019-07-12 ENCOUNTER — Ambulatory Visit
Admission: RE | Admit: 2019-07-12 | Discharge: 2019-07-12 | Disposition: A | Discharge status: Home / Self Care | Payer: Self-pay | Attending: Surgery | Admitting: Surgery

## 2019-07-12 ENCOUNTER — Other Ambulatory Visit: Payer: Self-pay

## 2019-07-12 ENCOUNTER — Ambulatory Visit: Payer: Self-pay | Admitting: Anesthesiology

## 2019-07-12 ENCOUNTER — Encounter: Payer: Self-pay | Admitting: Anesthesiology

## 2019-07-12 ENCOUNTER — Encounter
Admission: RE | Disposition: A | Discharge status: Home / Self Care | Payer: Self-pay | Source: Home / Self Care | Attending: Surgery

## 2019-07-12 DIAGNOSIS — N611 Abscess of the breast and nipple: Secondary | ICD-10-CM

## 2019-07-12 HISTORY — PX: INCISION AND DRAINAGE ABSCESS: SHX5864

## 2019-07-12 LAB — POCT PREGNANCY, URINE: Preg Test, Ur: NEGATIVE

## 2019-07-12 SURGERY — INCISION AND DRAINAGE, ABSCESS
Anesthesia: General | Laterality: Left

## 2019-07-12 MED ORDER — FENTANYL CITRATE (PF) 100 MCG/2ML IJ SOLN
INTRAMUSCULAR | Status: AC
  Administered 2019-07-12: 11:00:00 25 ug via INTRAVENOUS
  Filled 2019-07-12: qty 2

## 2019-07-12 MED ORDER — FENTANYL CITRATE (PF) 100 MCG/2ML IJ SOLN
INTRAMUSCULAR | Status: AC
  Filled 2019-07-12: qty 2

## 2019-07-12 MED ORDER — FENTANYL CITRATE (PF) 100 MCG/2ML IJ SOLN
25.0000 ug | INTRAMUSCULAR | Status: DC | PRN
  Administered 2019-07-12 (×4): 25 ug via INTRAVENOUS

## 2019-07-12 MED ORDER — HYDROCODONE-ACETAMINOPHEN 5-325 MG PO TABS: 1.0000 | ORAL_TABLET | Freq: Four times a day (QID) | ORAL | 0 refills | Status: DC | PRN

## 2019-07-12 MED ORDER — PHENYLEPHRINE HCL (PRESSORS) 10 MG/ML IV SOLN
INTRAVENOUS | Status: DC | PRN
  Administered 2019-07-12 (×2): 100 ug via INTRAVENOUS

## 2019-07-12 MED ORDER — MIDAZOLAM HCL 2 MG/2ML IJ SOLN
INTRAMUSCULAR | Status: AC
  Filled 2019-07-12: qty 2

## 2019-07-12 MED ORDER — LIDOCAINE HCL (CARDIAC) PF 100 MG/5ML IV SOSY
PREFILLED_SYRINGE | INTRAVENOUS | Status: DC | PRN
  Administered 2019-07-12: 100 mg via INTRAVENOUS

## 2019-07-12 MED ORDER — CHLORHEXIDINE GLUCONATE CLOTH 2 % EX PADS: 6.0000 | MEDICATED_PAD | Freq: Once | CUTANEOUS | Status: DC

## 2019-07-12 MED ORDER — DEXAMETHASONE SODIUM PHOSPHATE 10 MG/ML IJ SOLN
INTRAMUSCULAR | Status: DC | PRN
  Administered 2019-07-12: 10 mg via INTRAVENOUS

## 2019-07-12 MED ORDER — SULFAMETHOXAZOLE-TRIMETHOPRIM 800-160 MG PO TABS: 1.0000 | ORAL_TABLET | Freq: Two times a day (BID) | ORAL | 0 refills | Status: DC

## 2019-07-12 MED ORDER — MIDAZOLAM HCL 2 MG/2ML IJ SOLN
INTRAMUSCULAR | Status: DC | PRN
  Administered 2019-07-12: 2 mg via INTRAVENOUS

## 2019-07-12 MED ORDER — HYDROCODONE-ACETAMINOPHEN 5-325 MG PO TABS
1.0000 | ORAL_TABLET | Freq: Four times a day (QID) | ORAL | Status: DC | PRN
  Administered 2019-07-12: 12:00:00 1 via ORAL

## 2019-07-12 MED ORDER — CEFAZOLIN SODIUM-DEXTROSE 2-4 GM/100ML-% IV SOLN
INTRAVENOUS | Status: AC
  Filled 2019-07-12: qty 100

## 2019-07-12 MED ORDER — HYDROCODONE-ACETAMINOPHEN 5-325 MG PO TABS
ORAL_TABLET | ORAL | Status: AC
  Filled 2019-07-12: qty 1

## 2019-07-12 MED ORDER — ONDANSETRON HCL 4 MG/2ML IJ SOLN: 4.0000 mg | Freq: Once | INTRAMUSCULAR | Status: DC | PRN

## 2019-07-12 MED ORDER — KETOROLAC TROMETHAMINE 30 MG/ML IJ SOLN
INTRAMUSCULAR | Status: DC | PRN
  Administered 2019-07-12: 30 mg via INTRAVENOUS

## 2019-07-12 MED ORDER — PROPOFOL 10 MG/ML IV BOLUS
INTRAVENOUS | Status: DC | PRN
  Administered 2019-07-12: 50 mg via INTRAVENOUS
  Administered 2019-07-12: 150 mg via INTRAVENOUS

## 2019-07-12 MED ORDER — FENTANYL CITRATE (PF) 100 MCG/2ML IJ SOLN
INTRAMUSCULAR | Status: DC | PRN
  Administered 2019-07-12: 50 ug via INTRAVENOUS
  Administered 2019-07-12: 25 ug via INTRAVENOUS
  Administered 2019-07-12: 50 ug via INTRAVENOUS
  Administered 2019-07-12 (×3): 25 ug via INTRAVENOUS

## 2019-07-12 MED ORDER — LACTATED RINGERS IV SOLN
INTRAVENOUS | Status: DC
  Administered 2019-07-12: 08:00:00 via INTRAVENOUS

## 2019-07-12 MED ORDER — GLYCOPYRROLATE 0.2 MG/ML IJ SOLN
INTRAMUSCULAR | Status: DC | PRN
  Administered 2019-07-12: 0.2 mg via INTRAVENOUS

## 2019-07-12 MED ORDER — ONDANSETRON HCL 4 MG/2ML IJ SOLN
INTRAMUSCULAR | Status: DC | PRN
  Administered 2019-07-12: 4 mg via INTRAVENOUS

## 2019-07-12 MED ORDER — FAMOTIDINE 20 MG PO TABS
ORAL_TABLET | ORAL | Status: AC
  Administered 2019-07-12: 08:00:00 20 mg via ORAL
  Filled 2019-07-12: qty 1

## 2019-07-12 MED ORDER — FAMOTIDINE 20 MG PO TABS
20.0000 mg | ORAL_TABLET | Freq: Once | ORAL | Status: AC
  Administered 2019-07-12: 08:00:00 20 mg via ORAL

## 2019-07-12 SURGICAL SUPPLY — 19 items
BLADE CLIPPER SURG (BLADE) ×3 IMPLANT
BLADE SURG 15 STRL LF DISP TIS (BLADE) ×1 IMPLANT
BLADE SURG 15 STRL SS (BLADE) ×2
BRUSH SCRUB EZ  4% CHG (MISCELLANEOUS) ×2
BRUSH SCRUB EZ 4% CHG (MISCELLANEOUS) ×1 IMPLANT
CANISTER SUCT 3000ML PPV (MISCELLANEOUS) ×3 IMPLANT
COVER WAND RF STERILE (DRAPES) IMPLANT
DRAPE LAPAROTOMY 77X122 PED (DRAPES) ×3 IMPLANT
ELECT REM PT RETURN 9FT ADLT (ELECTROSURGICAL) ×3
ELECTRODE REM PT RTRN 9FT ADLT (ELECTROSURGICAL) ×1 IMPLANT
GLOVE BIO SURGEON STRL SZ7 (GLOVE) ×3 IMPLANT
GOWN STRL REUS W/ TWL LRG LVL3 (GOWN DISPOSABLE) ×2 IMPLANT
GOWN STRL REUS W/TWL LRG LVL3 (GOWN DISPOSABLE) ×4
NEEDLE HYPO 22GX1.5 SAFETY (NEEDLE) ×3 IMPLANT
NS IRRIG 1000ML POUR BTL (IV SOLUTION) ×3 IMPLANT
PACK BASIN MINOR ARMC (MISCELLANEOUS) ×3 IMPLANT
SOL PREP PVP 2OZ (MISCELLANEOUS) ×3
SOLUTION PREP PVP 2OZ (MISCELLANEOUS) ×1 IMPLANT
SPONGE LAP 18X18 RF (DISPOSABLE) ×3 IMPLANT

## 2019-07-12 NOTE — OR Nursing (Signed)
Interpreter Ronnald Collum in for d/c instructions

## 2019-07-12 NOTE — Anesthesia Post-op Follow-up Note (Signed)
Anesthesia QCDR form completed.        

## 2019-07-12 NOTE — Anesthesia Procedure Notes (Signed)
Procedure Name: LMA Insertion Date/Time: 07/12/2019 9:09 AM Performed by: Leander Rams, CRNA Pre-anesthesia Checklist: Patient identified, Emergency Drugs available, Suction available, Patient being monitored and Timeout performed Patient Re-evaluated:Patient Re-evaluated prior to induction Oxygen Delivery Method: Circle system utilized Preoxygenation: Pre-oxygenation with 100% oxygen Induction Type: IV induction Ventilation: Mask ventilation without difficulty LMA: LMA inserted LMA Size: 3.5 Number of attempts: 1 Dental Injury: Teeth and Oropharynx as per pre-operative assessment

## 2019-07-12 NOTE — Anesthesia Preprocedure Evaluation (Signed)
Anesthesia Evaluation  Patient identified by MRN, date of birth, ID band Patient awake    Reviewed: Allergy & Precautions, H&P , NPO status , Patient's Chart, lab work & pertinent test results  Airway Mallampati: III  TM Distance: <3 FB Neck ROM: full    Dental  (+) Chipped   Pulmonary neg pulmonary ROS, neg shortness of breath,           Cardiovascular Exercise Tolerance: Good hypertension, (-) angina(-) Past MI      Neuro/Psych negative neurological ROS  negative psych ROS   GI/Hepatic negative GI ROS, Neg liver ROS, neg GERD  ,  Endo/Other  negative endocrine ROS  Renal/GU      Musculoskeletal   Abdominal   Peds  Hematology negative hematology ROS (+)   Anesthesia Other Findings History reviewed. No pertinent past medical history.  History reviewed. No pertinent surgical history.  BMI    Body Mass Index:  26.09 kg/m      Reproductive/Obstetrics negative OB ROS                             Anesthesia Physical  Anesthesia Plan  ASA: II  Anesthesia Plan: General   Post-op Pain Management:    Induction: Intravenous  PONV Risk Score and Plan: Dexamethasone, Ondansetron, Midazolam and Treatment may vary due to age or medical condition  Airway Management Planned: LMA  Additional Equipment:   Intra-op Plan:   Post-operative Plan: Extubation in OR  Informed Consent: I have reviewed the patients History and Physical, chart, labs and discussed the procedure including the risks, benefits and alternatives for the proposed anesthesia with the patient or authorized representative who has indicated his/her understanding and acceptance.     Dental Advisory Given  Plan Discussed with: Anesthesiologist, CRNA and Surgeon  Anesthesia Plan Comments: (Patient consented for risks of anesthesia including but not limited to:  - adverse reactions to medications - damage to teeth, lips  or other oral mucosa - sore throat or hoarseness - Damage to heart, brain, lungs or loss of life  Patient voiced understanding.)        Anesthesia Quick Evaluation

## 2019-07-12 NOTE — Op Note (Signed)
  07/12/2019  9:57 AM  PATIENT:  Tamara Downs  43 y.o. 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 Incision and drainage of complex left breast abscesses x 2 using Ultrasound guidance.                           2. Sharp debridement of skin subcutaneous tissue                              measuring 23 square centimeters    SURGEON:  Surgeon(s) and Role:    * Tanish Sinkler F, MD - Primary  FINDINGS:  Complex left breast abscess 1 located between 8-9 o'clock superficial No other second cavity at 8:00 deeper close to the chest wall and with significant tracking into the deep tissues. No evidence of necrotizing soft tissue infection Cloudy and  seropurulent fluid drained and cultured from both cavities  ANESTHESIA: GETA  INDICATIONS FOR PROCEDURE Breast abscess  DICTATION:  Patient with recurrent mastitis consistent with chronic granulomatous mastitis with recurrent abscesses.  I saw her 2 days ago and she could not afford erythromycin.  She is here for incision and debridement as well as cultures of abscess.  Patient was explained about the procedure in detail. Risks, benefits and  possible complications and a consent was obtained. The patient was  taken to the operating room and placed in the supine position. She was prepped and draped in the usual sterile fashion.  Using the ultrasound we visualized 2 complex fluid collections once located at 9:00 superficial and the other located at 8:00 that he was deep and was tracking towards the retroareolar area but in a much deeper plane. With the ultrasound guidance we proceeded to perform an I&D of the superficial lesion using a 15 blade knife.  Sero-purulent fluid aspirated and culture. Another incision was created at 8:00 about 5 cm from the nipple and we were able to track down this cavity into the deeper tissues close to  the chest wall. There was complex luculations that we were able to lyse with a combination of finger fracture and suction device. All the loculations were broken down. Using a sharp curette we debrided the sub q tissue  Not to include fascia. Hemostasis was obtained with electrocautery. Irrigation with normal saline and the wounds were packed with half-inch packing.  Needle and laparotomy counts were correct and there were no immediate complications  Jules Husbands, MD

## 2019-07-12 NOTE — Discharge Instructions (Signed)
AMBULATORY SURGERY  °DISCHARGE INSTRUCTIONS ° ° °1) The drugs that you were given will stay in your system until tomorrow so for the next 24 hours you should not: ° °A) Drive an automobile °B) Make any legal decisions °C) Drink any alcoholic beverage ° ° °2) You may resume regular meals tomorrow.  Today it is better to start with liquids and gradually work up to solid foods. ° °You may eat anything you prefer, but it is better to start with liquids, then soup and crackers, and gradually work up to solid foods. ° ° °3) Please notify your doctor immediately if you have any unusual bleeding, trouble breathing, redness and pain at the surgery site, drainage, fever, or pain not relieved by medication. ° ° ° °4) Additional Instructions: ° ° ° ° ° ° ° °Please contact your physician with any problems or Same Day Surgery at [phone], Monday through Friday 6 am to 4 pm, or [place] [facility] [place] Main number at [phone]. °

## 2019-07-12 NOTE — Interval H&P Note (Signed)
History and Physical Interval Note:  07/12/2019 8:23 AM  [name]  has presented today for surgery, with the diagnosis of Left breast abscess.  The various methods of treatment have been discussed with the patient and family. After consideration of risks, benefits and other options for treatment, the patient has consented to  Procedure(s): INCISION AND DRAINAGE Left breast ABSCESS (Left) as a surgical intervention.  The patient's history has been reviewed, patient examined, no change in status, stable for surgery.  I have reviewed the patient's chart and labs.  Questions were answered to the patient's satisfaction.     [place]

## 2019-07-12 NOTE — Anesthesia Postprocedure Evaluation (Signed)
Anesthesia Post Note  Patient: Tamara Downs  Procedure(s) Performed: INCISION AND DRAINAGE Left breast ABSCESS (Left )  Patient location during evaluation: PACU Anesthesia Type: General Level of consciousness: awake and alert and oriented Pain management: pain level controlled Vital Signs Assessment: post-procedure vital signs reviewed and stable Respiratory status: spontaneous breathing Cardiovascular status: blood pressure returned to baseline Anesthetic complications: no     Last Vitals:  Vitals:   07/12/19 1052 07/12/19 1058  BP:  (!) 157/90  Pulse:  (!) 58  Resp:  16  Temp: (!) 36.1 C (!) 36.3 C  SpO2:  100%    Last Pain:  Vitals:   07/12/19 1058  TempSrc: Temporal  PainSc: 5                  Taeshaun Rames

## 2019-07-12 NOTE — Transfer of Care (Signed)
Immediate Anesthesia Transfer of Care Note  Patient: Tamara Downs  Procedure(s) Performed: INCISION AND DRAINAGE Left breast ABSCESS (Left )  Patient Location: PACU  Anesthesia Type:General  Level of Consciousness: awake  Airway & Oxygen Therapy: Patient Spontanous Breathing  Post-op Assessment: Report given to RN  Post vital signs: stable  Last Vitals:  Vitals Value Taken Time  BP 134/90 07/12/19 1007  Temp 36.1 C 07/12/19 1007  Pulse 77 07/12/19 1011  Resp 11 07/12/19 1011  SpO2 100 % 07/12/19 1011  Vitals shown include unvalidated device data.  Last Pain:  Vitals:   07/12/19 0755  TempSrc: Tympanic  PainSc: 0-No pain         Complications: No apparent anesthesia complications

## 2019-07-15 ENCOUNTER — Other Ambulatory Visit: Payer: Self-pay

## 2019-07-15 ENCOUNTER — Ambulatory Visit: Payer: Self-pay

## 2019-07-15 VITALS — BP 121/79 | HR 73 | Temp 97.5°F | Resp 16 | Ht 62.0 in | Wt 145.6 lb

## 2019-07-15 DIAGNOSIS — N611 Abscess of the breast and nipple: Secondary | ICD-10-CM

## 2019-07-15 NOTE — Patient Instructions (Addendum)
You may shower as usual. Wash the area with soap and water and rinse well and apply dressing. Please see your follow up appointment  listed below. Be sure to wear a sports bra.

## 2019-07-17 LAB — AEROBIC/ANAEROBIC CULTURE W GRAM STAIN (SURGICAL/DEEP WOUND)
Culture: NO GROWTH
Culture: NORMAL
Gram Stain: NONE SEEN

## 2019-07-19 ENCOUNTER — Encounter: Payer: Self-pay | Admitting: *Deleted

## 2019-07-19 DIAGNOSIS — N63 Unspecified lump in unspecified breast: Secondary | ICD-10-CM

## 2019-08-01 ENCOUNTER — Telehealth: Payer: Self-pay

## 2019-08-01 NOTE — Telephone Encounter (Signed)
  Telephone Triage Questions   Name / Date of surgery? Physician?     Left breast  I&D , Pabon, 07/12/2019  Pain ? Painful   Nausea, vomiting, Fever, chills? no  Any drainage? (Color) no  Does it feel hot to the touch? yes Any Bright redness around the wound/ area? yes   Antibiotics ? Completed course More than a week ago.  Spoke with patient- has appointment on Monday- would like to be seen sooner- patient states through interpreter. Left breast has purplish color, firmness. No drainage. Redness is present. Hot to the touch.  She feels a pulling sensation. She also has itching. No fever, or chills.

## 2019-08-02 ENCOUNTER — Other Ambulatory Visit
Admission: RE | Admit: 2019-08-02 | Discharge: 2019-08-02 | Disposition: A | Discharge status: Home / Self Care | Payer: Self-pay | Source: Ambulatory Visit | Attending: Surgery | Admitting: Surgery

## 2019-08-02 ENCOUNTER — Encounter: Payer: Self-pay | Admitting: *Deleted

## 2019-08-02 ENCOUNTER — Ambulatory Visit (INDEPENDENT_AMBULATORY_CARE_PROVIDER_SITE_OTHER): Payer: Self-pay | Admitting: Surgery

## 2019-08-02 ENCOUNTER — Encounter: Payer: Self-pay | Admitting: Surgery

## 2019-08-02 ENCOUNTER — Other Ambulatory Visit: Payer: Self-pay

## 2019-08-02 ENCOUNTER — Ambulatory Visit: Payer: Self-pay | Admitting: Anesthesiology

## 2019-08-02 ENCOUNTER — Ambulatory Visit: Payer: Self-pay

## 2019-08-02 ENCOUNTER — Ambulatory Visit
Admission: RE | Admit: 2019-08-02 | Discharge: 2019-08-02 | Disposition: A | Discharge status: Home / Self Care | Payer: Self-pay | Source: Ambulatory Visit | Attending: Surgery | Admitting: Surgery

## 2019-08-02 ENCOUNTER — Encounter
Admission: RE | Disposition: A | Discharge status: Home / Self Care | Payer: Self-pay | Source: Ambulatory Visit | Attending: Surgery

## 2019-08-02 ENCOUNTER — Other Ambulatory Visit: Payer: Self-pay | Admitting: Physician Assistant

## 2019-08-02 VITALS — BP 127/84 | HR 87 | Temp 97.5°F | Ht 60.0 in | Wt 146.0 lb

## 2019-08-02 DIAGNOSIS — Z20828 Contact with and (suspected) exposure to other viral communicable diseases: Secondary | ICD-10-CM | POA: Insufficient documentation

## 2019-08-02 DIAGNOSIS — N611 Abscess of the breast and nipple: Secondary | ICD-10-CM

## 2019-08-02 HISTORY — PX: INCISION AND DRAINAGE ABSCESS: SHX5864

## 2019-08-02 LAB — CBC WITH DIFFERENTIAL/PLATELET
Abs Immature Granulocytes: 0.02 10*3/uL (ref 0.00–0.07)
Basophils Absolute: 0 10*3/uL (ref 0.0–0.1)
Basophils Relative: 1 %
Eosinophils Absolute: 0.1 10*3/uL (ref 0.0–0.5)
Eosinophils Relative: 2 %
HCT: 38.6 % (ref 36.0–46.0)
Hemoglobin: 13.3 g/dL (ref 12.0–15.0)
Immature Granulocytes: 0 %
Lymphocytes Relative: 29 %
Lymphs Abs: 1.6 10*3/uL (ref 0.7–4.0)
MCH: 30.4 pg (ref 26.0–34.0)
MCHC: 34.5 g/dL (ref 30.0–36.0)
MCV: 88.1 fL (ref 80.0–100.0)
Monocytes Absolute: 0.4 10*3/uL (ref 0.1–1.0)
Monocytes Relative: 8 %
Neutro Abs: 3.2 10*3/uL (ref 1.7–7.7)
Neutrophils Relative %: 60 %
Platelets: 387 10*3/uL (ref 150–400)
RBC: 4.38 MIL/uL (ref 3.87–5.11)
RDW: 12.7 % (ref 11.5–15.5)
WBC: 5.4 10*3/uL (ref 4.0–10.5)
nRBC: 0 % (ref 0.0–0.2)

## 2019-08-02 LAB — BASIC METABOLIC PANEL
Anion gap: 4 — ABNORMAL LOW (ref 5–15)
BUN: 12 mg/dL (ref 6–20)
CO2: 23 mmol/L (ref 22–32)
Calcium: 8.8 mg/dL — ABNORMAL LOW (ref 8.9–10.3)
Chloride: 108 mmol/L (ref 98–111)
Creatinine, Ser: 0.56 mg/dL (ref 0.44–1.00)
GFR calc Af Amer: >60 mL/min (ref >60)
GFR calc non Af Amer: >60 mL/min (ref >60)
Glucose, Bld: 88 mg/dL (ref 70–99)
Potassium: 3.4 mmol/L — ABNORMAL LOW (ref 3.5–5.1)
Sodium: 135 mmol/L (ref 135–145)

## 2019-08-02 LAB — POCT PREGNANCY, URINE: Preg Test, Ur: NEGATIVE

## 2019-08-02 LAB — SARS CORONAVIRUS 2 BY RT PCR (HOSPITAL ORDER, PERFORMED IN ~~LOC~~ HOSPITAL LAB): SARS Coronavirus 2: NEGATIVE

## 2019-08-02 SURGERY — INCISION AND DRAINAGE, ABSCESS
Anesthesia: General | Site: Breast | Laterality: Left

## 2019-08-02 SURGERY — INCISION AND DRAINAGE, ABSCESS
Anesthesia: General | Laterality: Left

## 2019-08-02 MED ORDER — PROPOFOL 10 MG/ML IV BOLUS
INTRAVENOUS | Status: AC
  Filled 2019-08-02: qty 20

## 2019-08-02 MED ORDER — FENTANYL CITRATE (PF) 100 MCG/2ML IJ SOLN
INTRAMUSCULAR | Status: DC | PRN
  Administered 2019-08-02: 50 ug via INTRAVENOUS

## 2019-08-02 MED ORDER — FENTANYL CITRATE (PF) 100 MCG/2ML IJ SOLN
INTRAMUSCULAR | Status: AC
  Filled 2019-08-02: qty 2

## 2019-08-02 MED ORDER — GABAPENTIN 300 MG PO CAPS
ORAL_CAPSULE | ORAL | Status: AC
  Administered 2019-08-02: 13:00:00 300 mg via ORAL
  Filled 2019-08-02: qty 1

## 2019-08-02 MED ORDER — ONDANSETRON HCL 4 MG/2ML IJ SOLN: 4.0000 mg | Freq: Once | INTRAMUSCULAR | Status: DC | PRN

## 2019-08-02 MED ORDER — FENTANYL CITRATE (PF) 100 MCG/2ML IJ SOLN
INTRAMUSCULAR | Status: AC
  Administered 2019-08-02: 16:00:00 25 ug via INTRAVENOUS
  Filled 2019-08-02: qty 2

## 2019-08-02 MED ORDER — MIDAZOLAM HCL 2 MG/2ML IJ SOLN
INTRAMUSCULAR | Status: DC | PRN
  Administered 2019-08-02: 2 mg via INTRAVENOUS

## 2019-08-02 MED ORDER — ACETAMINOPHEN 500 MG PO TABS
1000.0000 mg | ORAL_TABLET | ORAL | Status: AC
  Administered 2019-08-02: 13:00:00 1000 mg via ORAL

## 2019-08-02 MED ORDER — HYDROCODONE-ACETAMINOPHEN 5-325 MG PO TABS: 1.0000 | ORAL_TABLET | Freq: Four times a day (QID) | ORAL | 0 refills | Status: DC | PRN

## 2019-08-02 MED ORDER — CEFAZOLIN SODIUM-DEXTROSE 2-4 GM/100ML-% IV SOLN
INTRAVENOUS | Status: AC
  Filled 2019-08-02: qty 100

## 2019-08-02 MED ORDER — LIDOCAINE HCL (CARDIAC) PF 100 MG/5ML IV SOSY
PREFILLED_SYRINGE | INTRAVENOUS | Status: DC | PRN
  Administered 2019-08-02: 100 mg via INTRAVENOUS

## 2019-08-02 MED ORDER — CHLORHEXIDINE GLUCONATE CLOTH 2 % EX PADS: 6.0000 | MEDICATED_PAD | Freq: Once | CUTANEOUS | Status: DC

## 2019-08-02 MED ORDER — MIDAZOLAM HCL 2 MG/2ML IJ SOLN
INTRAMUSCULAR | Status: AC
  Filled 2019-08-02: qty 2

## 2019-08-02 MED ORDER — FENTANYL CITRATE (PF) 100 MCG/2ML IJ SOLN
25.0000 ug | INTRAMUSCULAR | Status: AC | PRN
  Administered 2019-08-02 (×6): 25 ug via INTRAVENOUS

## 2019-08-02 MED ORDER — GABAPENTIN 300 MG PO CAPS
300.0000 mg | ORAL_CAPSULE | ORAL | Status: AC
  Administered 2019-08-02: 13:00:00 300 mg via ORAL

## 2019-08-02 MED ORDER — SULFAMETHOXAZOLE-TRIMETHOPRIM 800-160 MG PO TABS: 1.0000 | ORAL_TABLET | Freq: Two times a day (BID) | ORAL | 0 refills | Status: DC

## 2019-08-02 MED ORDER — DEXAMETHASONE SODIUM PHOSPHATE 10 MG/ML IJ SOLN
INTRAMUSCULAR | Status: DC | PRN
  Administered 2019-08-02: 10 mg via INTRAVENOUS

## 2019-08-02 MED ORDER — CEFAZOLIN SODIUM-DEXTROSE 2-4 GM/100ML-% IV SOLN
2.0000 g | INTRAVENOUS | Status: AC
  Administered 2019-08-02: 2 g via INTRAVENOUS

## 2019-08-02 MED ORDER — ACETAMINOPHEN 500 MG PO TABS
ORAL_TABLET | ORAL | Status: AC
  Administered 2019-08-02: 13:00:00 1000 mg via ORAL
  Filled 2019-08-02: qty 2

## 2019-08-02 MED ORDER — ONDANSETRON HCL 4 MG/2ML IJ SOLN
INTRAMUSCULAR | Status: DC | PRN
  Administered 2019-08-02: 4 mg via INTRAVENOUS

## 2019-08-02 MED ORDER — PROPOFOL 10 MG/ML IV BOLUS
INTRAVENOUS | Status: DC | PRN
  Administered 2019-08-02: 150 mg via INTRAVENOUS

## 2019-08-02 MED ORDER — LACTATED RINGERS IV SOLN
INTRAVENOUS | Status: DC | PRN
  Administered 2019-08-02: 15:00:00 via INTRAVENOUS

## 2019-08-02 MED ORDER — FENTANYL CITRATE (PF) 100 MCG/2ML IJ SOLN
INTRAMUSCULAR | Status: AC
  Administered 2019-08-02: 25 ug via INTRAVENOUS
  Filled 2019-08-02: qty 2

## 2019-08-02 SURGICAL SUPPLY — 22 items
BLADE CLIPPER SURG (BLADE) ×3 IMPLANT
BLADE SURG 15 STRL LF DISP TIS (BLADE) ×1 IMPLANT
BLADE SURG 15 STRL SS (BLADE) ×2
BRUSH SCRUB EZ  4% CHG (MISCELLANEOUS) ×2
BRUSH SCRUB EZ 4% CHG (MISCELLANEOUS) ×1 IMPLANT
CANISTER SUCT 3000ML PPV (MISCELLANEOUS) ×3 IMPLANT
COVER PROBE FLX POLY STRL (MISCELLANEOUS) ×3 IMPLANT
COVER WAND RF STERILE (DRAPES) ×3 IMPLANT
DRAPE LAPAROTOMY 77X122 PED (DRAPES) ×3 IMPLANT
ELECT REM PT RETURN 9FT ADLT (ELECTROSURGICAL) ×3
ELECTRODE REM PT RTRN 9FT ADLT (ELECTROSURGICAL) ×1 IMPLANT
GAUZE SPONGE 4X4 12PLY STRL (GAUZE/BANDAGES/DRESSINGS) ×3 IMPLANT
GLOVE BIO SURGEON STRL SZ7 (GLOVE) ×3 IMPLANT
GOWN STRL REUS W/ TWL LRG LVL3 (GOWN DISPOSABLE) ×2 IMPLANT
GOWN STRL REUS W/TWL LRG LVL3 (GOWN DISPOSABLE) ×4
NEEDLE HYPO 22GX1.5 SAFETY (NEEDLE) ×3 IMPLANT
NS IRRIG 1000ML POUR BTL (IV SOLUTION) ×3 IMPLANT
PACK BASIN MINOR ARMC (MISCELLANEOUS) ×3 IMPLANT
SOL PREP PVP 2OZ (MISCELLANEOUS)
SOLUTION PREP PVP 2OZ (MISCELLANEOUS) IMPLANT
SPONGE LAP 18X18 RF (DISPOSABLE) ×3 IMPLANT
SWAB DUAL CULTURE TRANS RED ST (MISCELLANEOUS) ×6 IMPLANT

## 2019-08-02 NOTE — Transfer of Care (Signed)
Immediate Anesthesia Transfer of Care Note  Patient: Tamara Downs  Procedure(s) Performed: INCISION AND DRAINAGE LEFT BREAST ABSCESS (Left )  Patient Location: PACU  Anesthesia Type:General  Level of Consciousness: sedated  Airway & Oxygen Therapy: Patient Spontanous Breathing and Patient connected to face mask oxygen  Post-op Assessment: Report given to RN and Post -op Vital signs reviewed and stable  Post vital signs: Reviewed and stable  Last Vitals:  Vitals Value Taken Time  BP    Temp    Pulse    Resp    SpO2      Last Pain:  Vitals:   08/02/19 1316  TempSrc: Temporal  PainSc: 0-No pain         Complications: No apparent anesthesia complications

## 2019-08-02 NOTE — Patient Instructions (Addendum)
We will arrange surgery for you for today. You will have a rapid Covid-19 test done. You will enter in through the [place].    [facility] al hospital  entrar en [facility]

## 2019-08-02 NOTE — Op Note (Signed)
  08/02/2019  3:11 PM  PATIENT:  Tamara Downs  43 y.o. female  PRE-OPERATIVE DIAGNOSIS:  Left breastl abscess  POST-OPERATIVE DIAGNOSIS:  Same  PROCEDURE:  1. Incision and drainage of complex Left breast abscess                           2. Sharp debridement of skin subcutaneous tissue                                     measuring 8 square centimeters    SURGEON:  Surgeon(s) and Role:    * Valeria Boza F, MD - Primary  FINDINGS: Abscess left breast 10 o'clock  ANESTHESIA: GEneral INDICATIONS FOR PROCEDURE  Abscess   DICTATION:  Patient was explained about the procedure in detail.  Risks, benefits and possible complications and a consent was obtained. The patient taken to the operating room and placed in the supine position. Lesion located 10'oclock incision was created and pus was drained and cultured. There was complex luculations that we were able to lyse with a combination of finger fracture and suction device. All the loculations were broken down. Using a sharp curette we debrided the sub q tissue . Hemostasis was obtained with electrocautery. Irrigation with normal saline and the wound was packed with half-inch packing. Needle and laparotomy counts were correct and there were no immediate complications  Jules Husbands, MD

## 2019-08-02 NOTE — Anesthesia Postprocedure Evaluation (Signed)
Anesthesia Post Note  Patient: Tamara Downs  Procedure(s) Performed: INCISION AND DRAINAGE LEFT BREAST ABSCESS (Left Breast)  Patient location during evaluation: PACU Anesthesia Type: General Level of consciousness: awake and alert Pain management: pain level controlled Vital Signs Assessment: post-procedure vital signs reviewed and stable Respiratory status: spontaneous breathing and respiratory function stable Cardiovascular status: stable Anesthetic complications: no     Last Vitals:  Vitals:   08/02/19 1635 08/02/19 1721  BP: 134/82 128/72  Pulse: 63   Resp: 20 18  Temp: (!) 36.1 C   SpO2: 100% 100%    Last Pain:  Vitals:   08/02/19 1635  TempSrc: Temporal  PainSc: 0-No pain                 Katelee Schupp K

## 2019-08-02 NOTE — Anesthesia Preprocedure Evaluation (Addendum)
Anesthesia Evaluation  Patient identified by MRN, date of birth, ID band Patient awake    Reviewed: Allergy & Precautions, NPO status , Patient's Chart, lab work & pertinent test results  History of Anesthesia Complications Negative for: history of anesthetic complications  Airway Mallampati: II       Dental   Pulmonary neg sleep apnea, neg COPD, Not current smoker,           Cardiovascular (-) hypertension(-) Past MI and (-) CHF (-) dysrhythmias (-) Valvular Problems/Murmurs     Neuro/Psych neg Seizures    GI/Hepatic Neg liver ROS, neg GERD  ,  Endo/Other  neg diabetes  Renal/GU negative Renal ROS     Musculoskeletal   Abdominal   Peds  Hematology   Anesthesia Other Findings   Reproductive/Obstetrics                             Anesthesia Physical Anesthesia Plan  ASA: I and emergent  Anesthesia Plan: General   Post-op Pain Management:    Induction: Intravenous  PONV Risk Score and Plan: 3 and Dexamethasone, Ondansetron and Midazolam  Airway Management Planned: LMA  Additional Equipment:   Intra-op Plan:   Post-operative Plan:   Informed Consent: I have reviewed the patients History and Physical, chart, labs and discussed the procedure including the risks, benefits and alternatives for the proposed anesthesia with the patient or authorized representative who has indicated his/her understanding and acceptance.       Plan Discussed with:   Anesthesia Plan Comments:         Anesthesia Quick Evaluation

## 2019-08-02 NOTE — Discharge Instructions (Signed)
AMBULATORY SURGERY  DISCHARGE INSTRUCTIONS   1) The drugs that you were given will stay in your system until tomorrow so for the next 24 hours you should not:  A) Drive an automobile B) Make any legal decisions C) Drink any alcoholic beverage   2) You may resume regular meals tomorrow.  Today it is better to start with liquids and gradually work up to solid foods.  You may eat anything you prefer, but it is better to start with liquids, then soup and crackers, and gradually work up to solid foods.   3) Please notify your doctor immediately if you have any unusual bleeding, trouble breathing, redness and pain at the surgery site, drainage, fever, or pain not relieved by medication.    4) Additional Instructions: Please make follow up appointment on Monday        Please contact your physician with any problems or Same Day Surgery at [phone], Monday through Friday 6 am to 4 pm, or  at [facility] number at [phone].

## 2019-08-02 NOTE — Interval H&P Note (Signed)
History and Physical Interval Note:  08/02/2019 12:54 PM  [place]  has presented today for surgery, with the diagnosis of Left Breast Abscess.  The various methods of treatment have been discussed with the patient and family. After consideration of risks, benefits and other options for treatment, the patient has consented to  Procedure(s): INCISION AND DRAINAGE LEFT BREAST ABSCESS (Left) as a surgical intervention.  The patient's history has been reviewed, patient examined, no change in status, stable for surgery.  I have reviewed the patient's chart and labs.  Questions were answered to the patient's satisfaction.     [place]

## 2019-08-02 NOTE — Anesthesia Procedure Notes (Signed)
Procedure Name: LMA Insertion Performed by: Azaleah Usman, CRNA Pre-anesthesia Checklist: Patient identified, Patient being monitored, Timeout performed, Emergency Drugs available and Suction available Patient Re-evaluated:Patient Re-evaluated prior to induction Oxygen Delivery Method: Circle system utilized Preoxygenation: Pre-oxygenation with 100% oxygen Induction Type: IV induction Ventilation: Mask ventilation without difficulty LMA: LMA inserted LMA Size: 3.5 Tube type: Oral Number of attempts: 1 Placement Confirmation: positive ETCO2 and breath sounds checked- equal and bilateral Tube secured with: Tape Dental Injury: Teeth and Oropharynx as per pre-operative assessment        

## 2019-08-02 NOTE — Anesthesia Post-op Follow-up Note (Signed)
Anesthesia QCDR form completed.        

## 2019-08-02 NOTE — Progress Notes (Signed)
08/02/2019  History of Present Illness: Tamara Downs is a 43 y.o. female presenting for follow up of left breast abscess, s/p I&D with Dr. Everlene FarrierPabon on 7/31.  She reports she had done well and was healing well, but 4 days ago started having soreness and itching of an area just superior to her I&D site, and has been worsening since.  She reports those are the symptoms she gets as the episodes start.  She had an appointment scheduled with Dr. Everlene FarrierPabon on 8/24 but did not want to wait until then so yesterday she called the office to be seen sooner.  Denies any fevers, chills, chest pain, shortness of breath.  Denies any drainage from her breast.  Past Medical History: Past Medical History:  Diagnosis Date  . Left breast abscess 02/28/2019     Past Surgical History: Past Surgical History:  Procedure Laterality Date  . INCISION AND DRAINAGE ABSCESS Left 03/12/2019   Procedure: INCISION AND DRAINAGE LEFT BREAST ABSCESS;  Surgeon: Ancil Linseyavis, Jason Evan, MD;  Location: [facility] ORS;  Service: General;  Laterality: Left;  . INCISION AND DRAINAGE ABSCESS Left 07/12/2019   Procedure: INCISION AND DRAINAGE Left breast ABSCESS;  Surgeon: Leafy RoPabon, Diego F, MD;  Location: [facility] ORS;  Service: General;  Laterality: Left;    Home Medications: Prior to Admission medications   Medication Sig Start Date End Date Taking? Authorizing Provider  Doxylamine Succinate, Sleep, (SLEEP-AID PO) Take 2 tablets by mouth at bedtime as needed (sleep).   Yes [provider]  ibuprofen (ADVIL) 600 MG tablet Take 1 tablet (600 mg total) by mouth every 6 (six) hours as needed. Patient taking differently: Take 600 mg by mouth every 6 (six) hours as needed for moderate pain.  06/27/19  Yes Donovan KailSchulz, Zachary R, PA-C    Allergies: No Known Allergies  Review of Systems: Review of Systems  Constitutional: Negative for chills and fever.  Respiratory: Negative for shortness of breath.   Cardiovascular: Negative for chest  pain.  Gastrointestinal: Negative for abdominal pain, nausea and vomiting.  Skin: Positive for itching.       Left breast pain    Physical Exam BP 127/84   Pulse 87   Temp (!) 97.5 F (36.4 C)   Ht 5' (1.524 m)   Wt 146 lb (66.2 kg)   LMP 07/23/2019   SpO2 99%   BMI 28.51 kg/m  CONSTITUTIONAL: No acute distress HEENT:  Normocephalic, atraumatic, extraocular motion intact. RESPIRATORY:  Lungs are clear, and breath sounds are equal bilaterally. Normal respiratory effort without pathologic use of accessory muscles. CARDIOVASCULAR: Heart is regular without murmurs, gallops, or rubs. BREAST:  Left breast s/p prior I&Ds.  Lower medial wound at 7-8 o'clock position healing with granulation tissue, measuring about 1 cm size.   Most recent I&D site at 9-10 o'clock healing well, mostly closed except for most superior portion of the wound.  There is a new area about 10 o clock, about 1 cm superior to the I&D site that has fluctuance.  U/S was used to evaluate her breast and the area shows fluid extending deep about 2 cm depth, tracking superiorly and medially. GI: The abdomen is soft, non-distended, non-tender.  NEUROLOGIC:  Motor and sensation is grossly normal.  Cranial nerves are grossly intact. PSYCH:  Alert and oriented to person, place and time. Affect is normal.  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  Discussed with there that given the symptoms and images on U/S, I am worried that she has a new developing abscess.  I recommend I&D of the abscess.  Discussed with the patient the option for doing this in the office or in the OR.  She reports that the one done in the office a few months ago was too painful and would rather do it in the OR.  Discussed with Dr. Dahlia Byes who is in the OR today and she will be added to his schedule for today.  She is NPO and has not had breakfast today. She understands  she will need to go to the hospital for preadmit, have a rapid COVID test for surgery today.  She has her son who is a minor in the clinic with her, so will go back home to drop him off and then come to hospital.  Furthermore, discussed with the patient that she would benefit from referral to Infectious Disease team to discuss antibiotic choice for likely a longer term duration given her recurrences.  Prior cultures have not had any growth.  She is also requesting referral to a breast surgeon for maintenance as well after this immediate issue is dealt with.  She reports she has financial approval at [facility] so we will do a referrral to [facility] breast surgery in the future.  Face-to-face time spent with the patient and care providers was 25 minutes, with more than 50% of the time spent counseling, educating, and coordinating care of the patient.     Melvyn Neth, [facility]

## 2019-08-03 ENCOUNTER — Observation Stay
Admission: EM | Admit: 2019-08-03 | Discharge: 2019-08-04 | Disposition: A | Discharge status: Home / Self Care | Payer: Self-pay | Attending: Surgery | Admitting: Surgery

## 2019-08-03 ENCOUNTER — Other Ambulatory Visit: Payer: Self-pay

## 2019-08-03 ENCOUNTER — Encounter: Payer: Self-pay | Admitting: Emergency Medicine

## 2019-08-03 ENCOUNTER — Emergency Department
Admission: EM | Admit: 2019-08-03 | Discharge: 2019-08-03 | Disposition: A | Discharge status: Home / Self Care | Payer: Self-pay | Attending: Emergency Medicine | Admitting: Emergency Medicine

## 2019-08-03 ENCOUNTER — Encounter: Payer: Self-pay | Admitting: Surgery

## 2019-08-03 DIAGNOSIS — Z5189 Encounter for other specified aftercare: Secondary | ICD-10-CM | POA: Insufficient documentation

## 2019-08-03 DIAGNOSIS — Z9889 Other specified postprocedural states: Secondary | ICD-10-CM | POA: Insufficient documentation

## 2019-08-03 DIAGNOSIS — R55 Syncope and collapse: Secondary | ICD-10-CM | POA: Insufficient documentation

## 2019-08-03 DIAGNOSIS — D649 Anemia, unspecified: Principal | ICD-10-CM | POA: Insufficient documentation

## 2019-08-03 DIAGNOSIS — N611 Abscess of the breast and nipple: Secondary | ICD-10-CM | POA: Insufficient documentation

## 2019-08-03 LAB — CBC
HCT: 34.2 % — ABNORMAL LOW (ref 36.0–46.0)
Hemoglobin: 11.7 g/dL — ABNORMAL LOW (ref 12.0–15.0)
MCH: 30.6 pg (ref 26.0–34.0)
MCHC: 34.2 g/dL (ref 30.0–36.0)
MCV: 89.5 fL (ref 80.0–100.0)
Platelets: 435 K/uL — ABNORMAL HIGH (ref 150–400)
RBC: 3.82 MIL/uL — ABNORMAL LOW (ref 3.87–5.11)
RDW: 12.9 % (ref 11.5–15.5)
WBC: 13 K/uL — ABNORMAL HIGH (ref 4.0–10.5)
nRBC: 0 % (ref 0.0–0.2)

## 2019-08-03 LAB — BASIC METABOLIC PANEL
Anion gap: 11 (ref 5–15)
BUN: 18 mg/dL (ref 6–20)
CO2: 20 mmol/L — ABNORMAL LOW (ref 22–32)
Calcium: 8.6 mg/dL — ABNORMAL LOW (ref 8.9–10.3)
Chloride: 105 mmol/L (ref 98–111)
Creatinine, Ser: 0.8 mg/dL (ref 0.44–1.00)
GFR calc Af Amer: >60 mL/min (ref >60)
GFR calc non Af Amer: >60 mL/min (ref >60)
Glucose, Bld: 144 mg/dL — ABNORMAL HIGH (ref 70–99)
Potassium: 3.2 mmol/L — ABNORMAL LOW (ref 3.5–5.1)
Sodium: 136 mmol/L (ref 135–145)

## 2019-08-03 LAB — TYPE AND SCREEN
ABO/RH(D): O POS
Antibody Screen: NEGATIVE

## 2019-08-03 MED ORDER — SODIUM CHLORIDE 0.9 % IV BOLUS
500.0000 mL | Freq: Once | INTRAVENOUS | Status: AC
  Administered 2019-08-03: 22:00:00 via INTRAVENOUS

## 2019-08-03 MED ORDER — SODIUM CHLORIDE 0.9 % IV BOLUS
500.0000 mL | Freq: Once | INTRAVENOUS | Status: AC
  Administered 2019-08-03: 23:00:00 500 mL via INTRAVENOUS

## 2019-08-03 NOTE — ED Notes (Signed)
Information obtained via eduardo #[id number]

## 2019-08-03 NOTE — ED Provider Notes (Signed)
[facility] Emergency Department Provider Note ____________________________________________   First MD Initiated Contact with Patient 08/03/19 1939     (approximate)  I have reviewed the triage vital signs and the nursing notes.   HISTORY  Chief Complaint Wound Check  History of present illness and review of systems obtained via video interpreter  HPI Tamara Downs is a 43 y.o. female with PMH as noted below who presents with bleeding from a surgical wound, acute onset 4 hours ago and persistent since then.  The patient states that she had a surgery on her left breast yesterday afternoon.  She states that she was doing well until this afternoon when she started to remove the dressing as she was told.  She reports that she immediately had a large amount of bleeding and it persisted since then.  She reports mild lightheadedness.  She denies any significant pain.  Past Medical History:  Diagnosis Date  . Left breast abscess 02/28/2019    There are no active problems to display for this patient.   Past Surgical History:  Procedure Laterality Date  . INCISION AND DRAINAGE ABSCESS Left 03/12/2019   Procedure: INCISION AND DRAINAGE LEFT BREAST ABSCESS;  Surgeon: Vickie Epley, MD;  Location: [facility] ORS;  Service: General;  Laterality: Left;  . INCISION AND DRAINAGE ABSCESS Left 07/12/2019   Procedure: INCISION AND DRAINAGE Left breast ABSCESS;  Surgeon: Jules Husbands, MD;  Location: [facility] ORS;  Service: General;  Laterality: Left;  . INCISION AND DRAINAGE ABSCESS Left 08/02/2019   Procedure: INCISION AND DRAINAGE LEFT BREAST ABSCESS;  Surgeon: Jules Husbands, MD;  Location: [facility] ORS;  Service: General;  Laterality: Left;    Prior to Admission medications   Medication Sig Start Date End Date Taking? Authorizing Provider  Doxylamine Succinate, Sleep, (SLEEP-AID PO) Take 2 tablets by mouth at bedtime as needed (sleep).    [provider]   HYDROcodone-acetaminophen (NORCO/VICODIN) 5-325 MG tablet Take 1-2 tablets by mouth every 6 (six) hours as needed for moderate pain. 08/02/19   Pabon, Diego F, MD  ibuprofen (ADVIL) 600 MG tablet Take 1 tablet (600 mg total) by mouth every 6 (six) hours as needed. Patient taking differently: Take 600 mg by mouth every 6 (six) hours as needed for moderate pain.  06/27/19   Tylene Fantasia, PA-C  sulfamethoxazole-trimethoprim (BACTRIM DS) 800-160 MG tablet Take 1 tablet by mouth 2 (two) times daily. 08/02/19   Jules Husbands, MD    Allergies Patient has no known allergies.  Family History  Problem Relation Age of Onset  . BRCA 1/2 Neg Hx     Social History Social History   Tobacco Use  . Smoking status: Never Smoker  . Smokeless tobacco: Never Used  Substance Use Topics  . Alcohol use: Never    Frequency: Never  . Drug use: Never    Review of Systems  Constitutional: No weakness. Eyes: No redness. ENT: No sore throat. Cardiovascular: Denies chest pain. Respiratory: Denies shortness of breath. Gastrointestinal: No vomiting or diarrhea.  Genitourinary: Negative for dysuria.  Musculoskeletal: Negative for back pain. Skin: Negative for rash. Neurological: Negative for headache.   ____________________________________________   PHYSICAL EXAM:  VITAL SIGNS: ED Triage Vitals  Enc Vitals Group     BP 08/03/19 1923 125/83     Pulse Rate 08/03/19 1923 66     Resp 08/03/19 1923 18     Temp 08/03/19 1925 98.4 F (36.9 C)  Temp Source 08/03/19 1925 Oral     SpO2 08/03/19 1923 100 %     Weight 08/03/19 1924 146 lb (66.2 kg)     Height 08/03/19 1924 '5\' 2"'  (1.575 m)     Head Circumference --      Peak Flow --      Pain Score 08/03/19 1924 0     Pain Loc --      Pain Edu? --      Excl. in [place]? --     Constitutional: Alert and oriented. Well appearing and in no acute distress. Eyes: Conjunctivae are normal.  Head: Atraumatic. Nose: No congestion/rhinnorhea.  Mouth/Throat: Mucous membranes are moist.   Neck: Normal range of motion.  Cardiovascular: Good peripheral circulation. Respiratory: Normal respiratory effort.  No retractions.  Gastrointestinal: No distention.  Musculoskeletal: Extremities warm and well perfused.  Neurologic:  Normal speech and language. No gross focal neurologic deficits are appreciated.  Skin:  Skin is warm and dry. No rash noted.  Left superior medial breast with approximately 2 cm surgical wound with packing in place.  Small amount of oozing but no active hemorrhage. Psychiatric: Speech and behavior are normal.  ____________________________________________   LABS (all labs ordered are listed, but only abnormal results are displayed)  Labs Reviewed - No data to display ____________________________________________  EKG   ____________________________________________  RADIOLOGY    ____________________________________________   PROCEDURES  Procedure(s) performed: No  Procedures  Critical Care performed: No ____________________________________________   INITIAL IMPRESSION / ASSESSMENT AND PLAN / ED COURSE  Pertinent labs & imaging results that were available during my care of the patient were reviewed by me and considered in my medical decision making (see chart for details).  43 year old female with a history of a left breast abscess I&D yesterday performed here by Dr. Dahlia Byes presents with bleeding over the last 4 hours after she started to remove the dressing as she was instructed to do.  She reports some mild lightheadedness.  On exam the patient is overall well-appearing and her vital signs are normal.  She has a dressing and a large amount of gauze and paper towels which are soaked with blood and clots.  However, at this time the wound shows no active hemorrhage and the packing is in place.  I consulted Dr. Lysle Pearl from general surgery who will come to evaluate the patient.   ----------------------------------------- 9:01 PM on 08/03/2019 -----------------------------------------  Dr. Lysle Pearl evaluated the patient.  He removed the packing and there is no longer any bleeding.  He replaced the packing and cleared the patient for discharge home.  He advised her to follow-up with Dr. Dahlia Byes on Monday.  At this time, the patient is stable for discharge.  Return precautions given.  ____________________________________________   FINAL CLINICAL IMPRESSION(S) / ED DIAGNOSES  Final diagnoses:  Wound check, abscess      NEW MEDICATIONS STARTED DURING THIS VISIT:  New Prescriptions   No medications on file     Note:  This document was prepared using Dragon voice recognition software and may include unintentional dictation errors.   Arta Silence, MD 08/03/19 2101

## 2019-08-03 NOTE — ED Triage Notes (Signed)
Patient had an I & D yesterday. Patient changed her dressing earlier today and patient had uncontrolled bleeding from the site. Patient was seen here earlier and the wound was repacked. After patient was discharged patient had a syncopal episode. Patient pale and diaphoretic at this time.

## 2019-08-03 NOTE — ED Notes (Signed)
Dr Cherylann Banas and Daiva Nakayama, RN at bedside at this time.

## 2019-08-03 NOTE — ED Notes (Addendum)
Meal tray, juice, additional warm blankets provided. Dressing placed to left breast after MD exam. Pt appears more comfortable. Pt assisted with cleansing of hands. No new bleeding from breast wound noted. Skin color improving.

## 2019-08-03 NOTE — ED Notes (Signed)
Surgeon at bedside to see pt at this time.

## 2019-08-03 NOTE — ED Triage Notes (Signed)
Patient states that she had surgery yesterday and reid to removed the bandage and has been bleeding times three hours.

## 2019-08-03 NOTE — ED Triage Notes (Signed)
Pt was discharged approx 10 min pta from bleeding from I and d to breast, per spouse pt with syncopal episode getting into car. Pt pale, pt states bandage of I and d bleeding significantly. Pt with blood noted on shirt.

## 2019-08-03 NOTE — ED Notes (Signed)
Pt reports "4 hrs" of bleeding since removing a bandage from an I&D that was performed earlier today.

## 2019-08-03 NOTE — ED Provider Notes (Signed)
[facility] Emergency Department Provider Note   ____________________________________________   First MD Initiated Contact with Patient 08/03/19 2213     (approximate)  I have reviewed the triage vital signs and the nursing notes.   HISTORY  Chief Complaint Loss of Consciousness  Spanish interpreter utilized  HPI Tamara Downs is a 43 y.o. female Patient was just seen at the ER, she had come for a wound check because she had had bleeding from her left breast wound.  She was discharged, reports she started feeling lightheaded she got to the car and then screamed out as she felt she was going out.  Husband reports he grabbed her she did not fall and he lowered to the ground and she had an episode where she passed out.  She was immediately brought back to the ER.  She reports she just feels a little lightheaded.  She is feeling better now she is laying down.  She is never had this happen before.  No chest pain no trouble breathing.  No nausea or vomiting.  No headache no neck pain.   Past Medical History:  Diagnosis Date  . Left breast abscess 02/28/2019    There are no active problems to display for this patient.   Past Surgical History:  Procedure Laterality Date  . INCISION AND DRAINAGE ABSCESS Left 03/12/2019   Procedure: INCISION AND DRAINAGE LEFT BREAST ABSCESS;  Surgeon: Vickie Epley, MD;  Location: [facility] ORS;  Service: General;  Laterality: Left;  . INCISION AND DRAINAGE ABSCESS Left 07/12/2019   Procedure: INCISION AND DRAINAGE Left breast ABSCESS;  Surgeon: Jules Husbands, MD;  Location: [facility] ORS;  Service: General;  Laterality: Left;  . INCISION AND DRAINAGE ABSCESS Left 08/02/2019   Procedure: INCISION AND DRAINAGE LEFT BREAST ABSCESS;  Surgeon: Jules Husbands, MD;  Location: [facility] ORS;  Service: General;  Laterality: Left;    Prior to Admission medications   Medication Sig Start Date End Date Taking? Authorizing Provider   Doxylamine Succinate, Sleep, (SLEEP-AID PO) Take 2 tablets by mouth at bedtime as needed (sleep).    [provider]  HYDROcodone-acetaminophen (NORCO/VICODIN) 5-325 MG tablet Take 1-2 tablets by mouth every 6 (six) hours as needed for moderate pain. 08/02/19   Pabon, Diego F, MD  ibuprofen (ADVIL) 600 MG tablet Take 1 tablet (600 mg total) by mouth every 6 (six) hours as needed. Patient taking differently: Take 600 mg by mouth every 6 (six) hours as needed for moderate pain.  06/27/19   Tylene Fantasia, PA-C  sulfamethoxazole-trimethoprim (BACTRIM DS) 800-160 MG tablet Take 1 tablet by mouth 2 (two) times daily. 08/02/19   Jules Husbands, MD    Allergies Patient has no known allergies.  Family History  Problem Relation Age of Onset  . BRCA 1/2 Neg Hx     Social History Social History   Tobacco Use  . Smoking status: Never Smoker  . Smokeless tobacco: Never Used  Substance Use Topics  . Alcohol use: Never    Frequency: Never  . Drug use: Never    Review of Systems Constitutional: No fever/chills Eyes: No visual changes. ENT: No sore throat. Cardiovascular: Denies chest pain.  Recent left breast wound drainage and was seen in the ER about an hour ago for same Respiratory: Denies shortness of breath. Gastrointestinal: No abdominal pain.   Genitourinary: Negative for dysuria. Musculoskeletal: Negative for back pain. Skin: Negative for rash. Neurological: Negative for headaches, areas of focal weakness  or numbness.  Passed out briefly.  No seizure-like activity.    ____________________________________________   PHYSICAL EXAM:  VITAL SIGNS: ED Triage Vitals  Enc Vitals Group     BP 08/03/19 2203 96/61     Pulse Rate 08/03/19 2201 (!) 58     Resp 08/03/19 2201 14     Temp 08/03/19 2201 98.3 F (36.8 C)     Temp Source 08/03/19 2201 Oral     SpO2 08/03/19 2201 100 %     Weight 08/03/19 2202 146 lb (66.2 kg)     Height --      Head Circumference --       Peak Flow --      Pain Score 08/03/19 2200 0     Pain Loc --      Pain Edu? --      Excl. in [place]? --     Constitutional: Alert and oriented. Well appearing and in no acute distress.  She does appear slightly fatigued all bit pale in complexion however. Eyes: Conjunctivae are normal. Head: Atraumatic. Nose: No congestion/rhinnorhea. Mouth/Throat: Mucous membranes are moist. Neck: No stridor.  Cardiovascular: Normal rate, regular rhythm. Grossly normal heart sounds.  Good peripheral circulation.  Left breast wound, packing within it, no drainage or ongoing bleeding. Respiratory: Normal respiratory effort.  No retractions. Lungs CTAB. Gastrointestinal: Soft and nontender. No distention. Musculoskeletal: No lower extremity tenderness nor edema. Neurologic:  Normal speech and language. No gross focal neurologic deficits are appreciated.  Skin:  Skin is warm, dry and intact. No rash noted. Psychiatric: Mood and affect are normal. Speech and behavior are normal.  ____________________________________________   LABS (all labs ordered are listed, but only abnormal results are displayed)  Labs Reviewed  CBC - Abnormal; Notable for the following components:      Result Value   WBC 13.0 (*)    RBC 3.82 (*)    Hemoglobin 11.7 (*)    HCT 34.2 (*)    Platelets 435 (*)    All other components within normal limits  BASIC METABOLIC PANEL - Abnormal; Notable for the following components:   Potassium 3.2 (*)    CO2 20 (*)    Glucose, Bld 144 (*)    Calcium 8.6 (*)    All other components within normal limits  CBC  TYPE AND SCREEN   ____________________________________________  EKG  Reviewed and interpreted by me at 2205 Heart rate 60 QRS 90 QTc 420 Sinus rhythm, no evidence of acute ischemia.  Mild J-point elevation seen in V2 and V3 is not consistent with acute MI. ____________________________________________  RADIOLOGY  No results found.    ____________________________________________   PROCEDURES  Procedure(s) performed: None  Procedures  Critical Care performed: No  ____________________________________________   INITIAL IMPRESSION / ASSESSMENT AND PLAN / ED COURSE  Pertinent labs & imaging results that were available during my care of the patient were reviewed by me and considered in my medical decision making (see chart for details).   Syncope.  Reports bleeding from her chest wound a fair amount or earlier, seen in the ER for same.  States she has been mildly hypotensive. No associated neurologic symptoms other than brief syncope.  No chest pain no trouble breathing  Patient fully alert and oriented, mild hypotension.  Will give fluid bolus, await labs including CBC.  Continue to monitor the patient closely.  Suspect likely syncope due to hypotension possibly due to acute blood loss anemia though unclear, she does report significant bleeding  occurred from the wound in her earlier presentation.    ----------------------------------------- 11:46 PM on 08/03/2019 -----------------------------------------  Discussed with patient, will monitor for repeat hemoglobin.  Blood pressures improved now and she is asymptomatic.  Discomfort with a plan to be observed in the ER, repeat hemoglobin which will be followed up by my partner Dr. Owens Shark.  Continue to observe the patient after syncopal episode, serial hemoglobin to exclude acute blood loss.  ____________________________________________   FINAL CLINICAL IMPRESSION(S) / ED DIAGNOSES  Final diagnoses:  Anemia, unspecified type  Syncope and collapse        Note:  This document was prepared using Dragon voice recognition software and may include unintentional dictation errors       Delman Kitten, MD 08/03/19 2348

## 2019-08-04 ENCOUNTER — Encounter: Payer: Self-pay | Admitting: Radiology

## 2019-08-04 ENCOUNTER — Emergency Department: Payer: Self-pay

## 2019-08-04 DIAGNOSIS — N611 Abscess of the breast and nipple: Secondary | ICD-10-CM | POA: Diagnosis present

## 2019-08-04 DIAGNOSIS — D649 Anemia, unspecified: Secondary | ICD-10-CM | POA: Diagnosis present

## 2019-08-04 HISTORY — DX: Anemia, unspecified: D64.9

## 2019-08-04 LAB — CBC
HCT: 26.3 % — ABNORMAL LOW (ref 36.0–46.0)
Hemoglobin: 8.9 g/dL — ABNORMAL LOW (ref 12.0–15.0)
MCH: 30.6 pg (ref 26.0–34.0)
MCHC: 33.8 g/dL (ref 30.0–36.0)
MCV: 90.4 fL (ref 80.0–100.0)
Platelets: 321 10*3/uL (ref 150–400)
RBC: 2.91 MIL/uL — ABNORMAL LOW (ref 3.87–5.11)
RDW: 13.2 % (ref 11.5–15.5)
WBC: 9.1 10*3/uL (ref 4.0–10.5)
nRBC: 0 % (ref 0.0–0.2)

## 2019-08-04 LAB — GLUCOSE, CAPILLARY: Glucose-Capillary: 101 mg/dL — ABNORMAL HIGH (ref 70–99)

## 2019-08-04 LAB — HEMOGLOBIN AND HEMATOCRIT, BLOOD
HCT: 27.9 % — ABNORMAL LOW (ref 36.0–46.0)
Hemoglobin: 9.4 g/dL — ABNORMAL LOW (ref 12.0–15.0)

## 2019-08-04 LAB — MRSA PCR SCREENING: MRSA by PCR: NEGATIVE

## 2019-08-04 MED ORDER — HYDROCODONE-ACETAMINOPHEN 5-325 MG PO TABS: 1.0000 | ORAL_TABLET | Freq: Four times a day (QID) | ORAL | Status: DC | PRN

## 2019-08-04 MED ORDER — IOHEXOL 300 MG/ML  SOLN
75.0000 mL | Freq: Once | INTRAMUSCULAR | Status: AC | PRN
  Administered 2019-08-04: 75 mL via INTRAVENOUS

## 2019-08-04 MED ORDER — SULFAMETHOXAZOLE-TRIMETHOPRIM 800-160 MG PO TABS
1.0000 | ORAL_TABLET | Freq: Two times a day (BID) | ORAL | Status: DC
  Administered 2019-08-04: 1 via ORAL
  Filled 2019-08-04 (×2): qty 1

## 2019-08-04 MED ORDER — ONDANSETRON HCL 4 MG/2ML IJ SOLN: 4.0000 mg | Freq: Four times a day (QID) | INTRAMUSCULAR | Status: DC | PRN

## 2019-08-04 MED ORDER — LACTATED RINGERS IV SOLN
INTRAVENOUS | Status: DC
  Administered 2019-08-04: 11:00:00 via INTRAVENOUS

## 2019-08-04 MED ORDER — SODIUM CHLORIDE 0.9 % IV BOLUS
1000.0000 mL | Freq: Once | INTRAVENOUS | Status: AC
  Administered 2019-08-04: 1000 mL via INTRAVENOUS

## 2019-08-04 MED ORDER — ONDANSETRON 4 MG PO TBDP
4.0000 mg | ORAL_TABLET | Freq: Four times a day (QID) | ORAL | Status: DC | PRN
  Filled 2019-08-04: qty 1

## 2019-08-04 MED ORDER — DOCUSATE SODIUM 100 MG PO CAPS: 100.0000 mg | ORAL_CAPSULE | Freq: Two times a day (BID) | ORAL | Status: DC | PRN

## 2019-08-04 MED ORDER — DOXYLAMINE SUCCINATE (SLEEP) 25 MG PO TABS
25.0000 mg | ORAL_TABLET | Freq: Every evening | ORAL | Status: DC | PRN
  Filled 2019-08-04: qty 1

## 2019-08-04 NOTE — ED Notes (Signed)
Pt's family has brought food for pt's husband. This rn out to parking lot with husband as an escort to obtain meal for husband.

## 2019-08-04 NOTE — ED Notes (Signed)
Pt's spouse provided with additional warm blankets.

## 2019-08-04 NOTE — ED Notes (Signed)
ED TO INPATIENT HANDOFF REPORT  ED Nurse Name and Phone #: Danelle Earthlyoel [phone]  [id number]  S Name/Age/Gender Tamara Downs 43 y.o. female Room/Bed: ED12A/ED12A  Code Status   Code Status: Not on file  Home/SNF/Other Home Patient oriented to: self, place, time and situation Is this baseline? Yes   Triage Complete: Triage complete  Chief Complaint LOC  Triage Note Pt was discharged approx 10 min pta from bleeding from I and d to breast, per spouse pt with syncopal episode getting into car. Pt pale, pt states bandage of I and d bleeding significantly. Pt with blood noted on shirt.   Patient had an I & D yesterday. Patient changed her dressing earlier today and patient had uncontrolled bleeding from the site. Patient was seen here earlier and the wound was repacked. After patient was discharged patient had a syncopal episode. Patient pale and diaphoretic at this time.    Allergies No Known Allergies  Level of Care/Admitting Diagnosis ED Disposition    ED Disposition Condition Comment   Admit  Hospital [facility] [[id number]]  Level of Care: Med-Surg [16]  Covid Evaluation: Asymptomatic Screening Protocol (No Symptoms)  Diagnosis: Breast abscess [209092]  Admitting Physician: Sung AmabileSAKAI, ISAMI [[id number]]  Attending Physician: Sung AmabileSAKAI, ISAMI [phone][[id number]]  PT Class (Do Not Modify): Observation [104]  PT Acc Code (Do Not Modify): Observation [10022]       B Medical/Surgery History Past Medical History:  Diagnosis Date  . Left breast abscess 02/28/2019   Past Surgical History:  Procedure Laterality Date  . INCISION AND DRAINAGE ABSCESS Left 03/12/2019   Procedure: INCISION AND DRAINAGE LEFT BREAST ABSCESS;  Surgeon: Ancil Linseyavis, Jason Evan, MD;  Location: [facility] ORS;  Service: General;  Laterality: Left;  . INCISION AND DRAINAGE ABSCESS Left 07/12/2019   Procedure: INCISION AND DRAINAGE Left breast ABSCESS;  Surgeon: Leafy RoPabon, Diego F, MD;  Location: [facility] ORS;  Service: General;   Laterality: Left;  . INCISION AND DRAINAGE ABSCESS Left 08/02/2019   Procedure: INCISION AND DRAINAGE LEFT BREAST ABSCESS;  Surgeon: Leafy RoPabon, Diego F, MD;  Location: [facility] ORS;  Service: General;  Laterality: Left;     A IV Location/Drains/Wounds Patient Lines/Drains/Airways Status   Active Line/Drains/Airways    Name:   Placement date:   Placement time:   Site:   Days:   Peripheral IV 08/03/19 Right Antecubital   08/03/19    2206    Antecubital   1   Open Drain 2 Left Breast   03/12/19    1259    Breast   145   Airway   08/02/19    1522     2   Incision (Closed) 03/12/19 Breast   03/12/19    1309     145   Incision (Closed) 07/12/19 Breast Left   07/12/19    0925     23   Incision (Closed) 08/02/19 Breast Left   08/02/19    1523     2          Intake/Output Last 24 hours  Intake/Output Summary (Last 24 hours) at 08/04/2019 0631 Last data filed at 08/04/2019 0353 Gross per 24 hour  Intake 2000 ml  Output -  Net 2000 ml    Labs/Imaging Results for orders placed or performed during the hospital encounter of 08/03/19 (from the past 48 hour(s))  CBC     Status: Abnormal   Collection Time: 08/03/19 10:09 PM  Result Value Ref Range   WBC 13.0 (H) 4.0 -  10.5 K/uL   RBC 3.82 (L) 3.87 - 5.11 MIL/uL   Hemoglobin 11.7 (L) 12.0 - 15.0 g/dL   HCT 34.2 (L) 36.0 - 46.0 %   MCV 89.5 80.0 - 100.0 fL   MCH 30.6 26.0 - 34.0 pg   MCHC 34.2 30.0 - 36.0 g/dL   RDW 12.9 11.5 - 15.5 %   Platelets 435 (H) 150 - 400 K/uL   nRBC 0.0 0.0 - 0.2 %    Comment: Performed at [facility], [address] [place], [place] [address]  Basic metabolic panel     Status: Abnormal   Collection Time: 08/03/19 10:09 PM  Result Value Ref Range   Sodium 136 135 - 145 mmol/L   Potassium 3.2 (L) 3.5 - 5.1 mmol/L   Chloride 105 98 - 111 mmol/L   CO2 20 (L) 22 - 32 mmol/L   Glucose, Bld 144 (H) 70 - 99 mg/dL   BUN 18 6 - 20 mg/dL   Creatinine, Ser 0.80 0.44 - 1.00 mg/dL   Calcium 8.6 (L) 8.9 - 10.3  mg/dL   GFR calc non Af Amer >60 >60 mL/min   GFR calc Af Amer >60 >60 mL/min   Anion gap 11 5 - 15    Comment: Performed at [facility], [address] [place], [place] [address]  Type and screen [place]     Status: None   Collection Time: 08/03/19 10:09 PM  Result Value Ref Range   ABO/RH(D) O POS    Antibody Screen NEG    Sample Expiration      08/06/2019,2359 Performed at [facility], [address] [place], [place] [address]   CBC     Status: Abnormal   Collection Time: 08/04/19  3:53 AM  Result Value Ref Range   WBC 9.1 4.0 - 10.5 K/uL   RBC 2.91 (L) 3.87 - 5.11 MIL/uL   Hemoglobin 8.9 (L) 12.0 - 15.0 g/dL   HCT 26.3 (L) 36.0 - 46.0 %   MCV 90.4 80.0 - 100.0 fL   MCH 30.6 26.0 - 34.0 pg   MCHC 33.8 30.0 - 36.0 g/dL   RDW 13.2 11.5 - 15.5 %   Platelets 321 150 - 400 K/uL   nRBC 0.0 0.0 - 0.2 %    Comment: Performed at [facility], [address] [place], [place] [address]   Ct Chest W Contrast  Result Date: 08/04/2019 CLINICAL DATA:  Patient with syncopal episode. EXAM: CT CHEST WITH CONTRAST TECHNIQUE: Multidetector CT imaging of the chest was performed during intravenous contrast administration. CONTRAST:  38mL OMNIPAQUE IOHEXOL 300 MG/ML  SOLN COMPARISON:  None. FINDINGS: Cardiovascular: Normal heart size. Trace fluid superior pericardial recess. Aorta and main pulmonary artery normal in caliber. Mediastinum/Nodes: No enlarged axillary, mediastinal or hilar lymphadenopathy. Normal appearance of the esophagus. Lungs/Pleura: Central airways are patent. Dependent atelectasis within the bilateral lower lobes. No pleural effusion or pneumothorax. Upper Abdomen: No acute process. Musculoskeletal: No aggressive or acute appearing osseous lesions. Postsurgical changes within the medial left breast. Within the central aspect of the right breast there is a 2.8 cm enhancing mass (image 75; series 2). IMPRESSION: 1. No acute  process within the chest. 2. There is a 2.9 cm enhancing mass within the central aspect of the right breast, incompletely visualized. Recommend correlation with dedicated diagnostic mammography. 3. Postsurgical changes within the medial left breast. No discrete fluid collection. Electronically Signed   By: Polly Cobia.D.  On: 08/04/2019 05:49    Pending Labs Unresulted Labs (From admission, onward)    Start     Ordered   Signed and Held  Hemoglobin and hematocrit, blood  Once-Timed,   R     Signed and Held          Vitals/Pain Today's Vitals   08/04/19 0330 08/04/19 0400 08/04/19 0430 08/04/19 0607  BP: (!) 77/67 (!) 90/52 (!) 86/52 (!) 95/57  Pulse: (!) 57 (!) 57 (!) 57 (!) 56  Resp: 15 17 17 14   Temp:      TempSrc:      SpO2: 99% 98% 97% 99%  Weight:      PainSc:        Isolation Precautions No active isolations  Medications Medications  sodium chloride 0.9 % bolus 500 mL (0 mLs Intravenous Stopped 08/03/19 2310)  sodium chloride 0.9 % bolus 500 mL (0 mLs Intravenous Stopped 08/04/19 0010)  sodium chloride 0.9 % bolus 1,000 mL (0 mLs Intravenous Stopped 08/04/19 0353)  iohexol (OMNIPAQUE) 300 MG/ML solution 75 mL (75 mLs Intravenous Contrast Given 08/04/19 0454)    Mobility walks Low fall risk   Focused Assessments Cardiac Assessment Handoff:  Cardiac Rhythm: Normal sinus rhythm No results found for: CKTOTAL, CKMB, CKMBINDEX, TROPONINI No results found for: DDIMER Does the Patient currently have chest pain? No      R Recommendations: See Admitting Provider Note  Report given to:   Additional Notes: husband at bedside; spanish only

## 2019-08-04 NOTE — ED Notes (Signed)
Pillow given to spouse. Lights dimmed for comfort. Pt denies further needs. Pt appears in no acute distress, call bell at right side.

## 2019-08-04 NOTE — Discharge Summary (Signed)
Physician Discharge Summary  Patient ID: Tamara Downs MRN: [id number] DOB/AGE: 1976-03-20 43 y.o.  Admit date: 08/03/2019 Discharge date: 08/04/2019  Admission Diagnoses: anemia  Discharge Diagnoses:  Same as above  Discharged Condition: good  Hospital Course: admitted for decreasing Hgb levels and syncope after episodes of bleeding from left breast abscess I&D site.  No further syncope episodes and BP remained stable.  Repeat Hgb improved without any intervention as well, so will be d/c'd for further outpt care.  Consults: None  Discharge Exam: Blood pressure 103/60, pulse 74, temperature 98.4 F (36.9 C), temperature source Oral, resp. rate 19, height 5\' 2"  (1.575 m), weight 68.7 kg, last menstrual period 07/23/2019, SpO2 100 %. General appearance: alert, cooperative and no distress Skin: left breast abscess I&D packing not soaked with blood, decreased tenderness around site.  no induration, extensive bruising noted.  Disposition:  Discharge disposition: 01-Home or Self Care        Allergies as of 08/04/2019   No Known Allergies     Medication List    TAKE these medications   HYDROcodone-acetaminophen 5-325 MG tablet Commonly known as: NORCO/VICODIN Take 1-2 tablets by mouth every 6 (six) hours as needed for moderate pain.   SLEEP-AID PO Take 2 tablets by mouth at bedtime as needed (sleep).   sulfamethoxazole-trimethoprim 800-160 MG tablet Commonly known as: BACTRIM DS Take 1 tablet by mouth 2 (two) times daily.      Follow-up Information    Schedule an appointment as soon as possible for a visit  with Freddy Finner, NP.   Specialty: Nurse Practitioner Why: For ER visit follow-up and reevaluation. Contact information: [place] [address] [phone]        Schedule an appointment as soon as possible for a visit  with Jules Husbands, MD.   Specialty: General Surgery Why: For ER visit follow-up and  reevaluation. Contact information: [address] [place] [address] [phone]            Total time spent arranging discharge was >35min. Signed: Benjamine Sprague 08/04/2019, 4:07 PM

## 2019-08-04 NOTE — ED Notes (Signed)
2C refusing patient. Will notify admitting MD.

## 2019-08-04 NOTE — ED Notes (Signed)
Messaged Admitting MD regarding conversion to ICU step-down. Will change orders.

## 2019-08-04 NOTE — H&P (Addendum)
Subjective:   CC: Bleeding from left abscess I&D site, anemia  HPI:  Tamara Downs is a 43 y.o. female who was consulted by [facility] for evaluation of above.  History of recurrent left breast abscess with latest I&D 2 days ago.  Patient was instructed to change the dressing postop day 1.  During the attempted dressing change, patient reported large amounts of active bleeding from the site therefore presented to the ED.  At the time of arrival patient had fair amount of bleeding noted on the overlying dressing as well as her close.  General surgery was consulted for further evaluation for any signs of active bleeding.  Of note, report was obtained via interpreter.  At the time of exam patient was not complaining of any further signs of active bleeding, minimal tenderness in the surrounding area.   Past Medical History:  has a past medical history of Left breast abscess (02/28/2019).  Right breast fibroadenomas, last mammogram January 2019  Past Surgical History:  has a past surgical history that includes Incision and drainage abscess (Left, 03/12/2019); Incision and drainage abscess (Left, 07/12/2019); and Incision and drainage abscess (Left, 08/02/2019).  Family History: Reviewed and not relevant to exam  Social History:  reports that she has never smoked. She has never used smokeless tobacco. She reports that she does not drink alcohol or use drugs.  Current Medications: Norco and Bactrim  Allergies:  No Known Allergies  ROS:  General: Denies weight loss, weight gain, fatigue, fevers, chills, and night sweats. Eyes: Denies blurry vision, double vision, eye pain, itchy eyes, and tearing. Ears: Denies hearing loss, earache, and ringing in ears. Nose: Denies sinus pain, congestion, infections, runny nose, and nosebleeds. Mouth/throat: Denies hoarseness, sore throat, bleeding gums, and difficulty swallowing. Heart: Denies chest pain, palpitations, racing heart, irregular heartbeat,  leg pain or swelling, and decreased activity tolerance. Respiratory: Denies breathing difficulty, shortness of breath, wheezing, cough, and sputum. GI: Denies change in appetite, heartburn, nausea, vomiting, constipation, diarrhea, and blood in stool. GU: Denies difficulty urinating, pain with urinating, urgency, frequency, blood in urine. Musculoskeletal: Denies joint stiffness, pain, swelling, muscle weakness. Skin: Denies rash, itching, mass, tumors, sores, and boils Neurologic: Denies headache, fainting, dizziness, seizures, numbness, and tingling. Psychiatric: Denies depression, anxiety, difficulty sleeping, and memory loss. Endocrine: Denies heat or cold intolerance, and increased thirst or urination. Blood/lymph: Denies easy bruising, easy bruising, and swollen glands     Objective:     BP (!) 89/63   Pulse 61   Temp 98.3 F (36.8 C) (Oral)   Resp 16   Wt 66.2 kg   LMP 07/23/2019   SpO2 98%   BMI 26.70 kg/m   Constitutional :  alert, cooperative, appears stated age and no distress  Lymphatics/Throat:  no asymmetry, masses, or scars  Respiratory:  clear to auscultation bilaterally  Cardiovascular:  regular rate and rhythm  Gastrointestinal: soft, non-tender; bowel sounds normal; no masses,  no organomegaly.  Musculoskeletal: Steady gait and movement  Skin: Cool and moist.  Chaperone present for exam.  Left breast abscess former I&D site measuring approximately 2-1/2 cm in the upper inner quadrant.  Bloody packing noted within.  After removal of packing there was no evidence of active bleeding within the wound that shows healthy granulation tissue already forming.  Wound itself measures approximately 3 cm deep by 2-1/2 cm wide by 1 cm.  Tenderness to palpation noted at the incision site.  Otherwise, no persistent induration erythema or extensive ecchymosis in the  surrounding skin.  Psychiatric: Normal affect, non-agitated, not confused       LABS:  CMP Latest Ref Rng &  Units 08/03/2019 08/02/2019 02/16/2019  Glucose 70 - 99 mg/dL 161(W144(H) 88 99  BUN 6 - 20 mg/dL 18 12 15   Creatinine 0.44 - 1.00 mg/dL 9.600.80 4.540.56 0.980.69  Sodium 135 - 145 mmol/L 136 135 141  Potassium 3.5 - 5.1 mmol/L 3.2(L) 3.4(L) 3.5  Chloride 98 - 111 mmol/L 105 108 107  CO2 22 - 32 mmol/L 20(L) 23 26  Calcium 8.9 - 10.3 mg/dL 1.1(B8.6(L) 1.4(N8.8(L) 8.2(N8.8(L)  Total Protein 6.5 - 8.1 g/dL - - 7.5  Total Bilirubin 0.3 - 1.2 mg/dL - - 5.6(O0.2(L)  Alkaline Phos 38 - 126 U/L - - 108  AST 15 - 41 U/L - - 24  ALT 0 - 44 U/L - - 16   CBC Latest Ref Rng & Units 08/04/2019 08/03/2019 08/02/2019  WBC 4.0 - 10.5 K/uL 9.1 13.0(H) 5.4  Hemoglobin 12.0 - 15.0 g/dL 1.3(Y8.9(L) 11.7(L) 13.3  Hematocrit 36.0 - 46.0 % 26.3(L) 34.2(L) 38.6  Platelets 150 - 400 K/uL 321 435(H) 387     RADS: CLINICAL DATA:  Patient with syncopal episode.  EXAM: CT CHEST WITH CONTRAST  TECHNIQUE: Multidetector CT imaging of the chest was performed during intravenous contrast administration.  CONTRAST:  75mL OMNIPAQUE IOHEXOL 300 MG/ML  SOLN  COMPARISON:  None.  FINDINGS: Cardiovascular: Normal heart size. Trace fluid superior pericardial recess. Aorta and main pulmonary artery normal in caliber.  Mediastinum/Nodes: No enlarged axillary, mediastinal or hilar lymphadenopathy. Normal appearance of the esophagus.  Lungs/Pleura: Central airways are patent. Dependent atelectasis within the bilateral lower lobes. No pleural effusion or pneumothorax.  Upper Abdomen: No acute process.  Musculoskeletal: No aggressive or acute appearing osseous lesions. Postsurgical changes within the medial left breast. Within the central aspect of the right breast there is a 2.8 cm enhancing mass (image 75; series 2).  IMPRESSION: 1. No acute process within the chest. 2. There is a 2.9 cm enhancing mass within the central aspect of the right breast, incompletely visualized. Recommend correlation with dedicated diagnostic mammography. 3.  Postsurgical changes within the medial left breast. No discrete fluid collection.   Electronically Signed   By: Annia Beltrew  Davis M.D.   On: 08/04/2019 05:49   Assessment:     Left breast abscess status post I&D, anemia. Incidental right breast mass noted on CT   Plan:     After initial exam was completed at approximately 9:30 PM on August 22, there was no sign of further active bleeding and the wound was able to be packed with corner 4 x 4 without any issues.  Vitals were stable, and the patient showed no signs of any other acute distress.  The patient, family member at bedside, and myself through the interpreter had an extensive discussion about proper wound care.  This included keeping the packing in place for the next 2 days until she can follow-up in Dr. Hurman HornPabon's office for another wound check and initial dressing change.  I explained to her that keeping the packing in place for 2 days when sure that any further bleeding will be minimized.  Patient verbalized understanding and was in agreement with plan at that time.  ED provider was notified of plan and he was in agreement as well.  Couple hours later, I was notified by the emergency department that the patient had a syncopal episode while being discharged from the ED.  Per verbal report,  she was noted to be hypotensive and a CBC drawn at that time, showed a slight decrease in the hemoglobin compared to the previous exam.  ED provider stated that he will provide a bolus fluid administration and then recheck a another hemoglobin in a few hours to see if there is any evidence of continued bleeding.  Again per verbal report, the ED provider reported that there was no signs of active bleeding on clinical exam.  Follow-up hemoglobin taken few hours later showed a continuing decrease white count, with still soft blood pressure measurements.  The new ED provider at that time ordered a CT chest, which showed no evidence of a developing fluid collection  or signs of active bleeding.    Based on the leukocytosis noted on the initial CBC count, I believe there was a component of hemoconcentration that falsely elevated the hemoglobin level.  She did lose a moderate amount of blood based on clinical exam, but I do not believe there is any continuing active bleeding at this time.  However, it was decided that the patient should be admitted for observation to ensure the hemoglobin levels plateau over the next several lab measurements.  Review of previous records including a mammogram and ultrasound from March 2019 notes a stable fibroadenoma noted in the right breast.  Incidental finding of right breast mass on the CT likely is this already known fibroadenoma.  Will be sure to ensure follow-up mammogram since she is already due for one.

## 2019-08-04 NOTE — Progress Notes (Signed)
Patient was admitted to ICU, and then her doctor said she could go home.  Her MD came in to see the patient.  Then, using translation services, he explained her discharge instructions and answered her questions.  Patient and her significant other understand the discharge instructions and are happy to go because she has two children at home.  Discharge papers and instructions were printed in [place].  RN removed IVs and called supply to get her a shirt because her shirt was covered in blood.  Patient is wearing her jeans.  Patient will leave in a private vehicle with her significant other.   Phillis Knack, RN

## 2019-08-04 NOTE — ED Provider Notes (Signed)
I assume care the patient from Dr. Jacqualine Code at 11:30 PM with recommendation to follow-up on repeat hemoglobin at 430.  Given patient's hypotension hemoglobin was obtained earlier and revealed a hemoglobin of 8.9 which is down from 11.7 at 10:00 PM last night.  Patient's hemoglobin was 13.3 on the 21st.  Patient discussed with Dr. Lysle Pearl general surgery for hospital admission with concern for possible continued hemorrhage with resultant hypotension and may have been the etiology of the patient syncopal episode earlier.  CT scan of the chest will be performed at this time    Gregor Hams, MD 08/04/19 [phone]

## 2019-08-04 NOTE — Discharge Instructions (Signed)
Please keep dressing intact until followup in office.  Apply pressure to area for at least 72min if bleeding recurs  Please obtain your yearly mammogram since you are due for another one.

## 2019-08-04 NOTE — ED Notes (Signed)
Pt assisted up to commode to void. Pt ambulatory without assist, however RN on standby at bedside.

## 2019-08-04 NOTE — ED Notes (Signed)
Called charge RN on floor to review accepting patient. Charge Archivist will call [facility] regarding accepting patient. Will continue to follow.

## 2019-08-04 NOTE — ED Notes (Signed)
Reports declined, Museum/gallery conservator, RN is reviewing appropriateness for floor assignment with RN super  EMS arrived to room 11 and room 16 to receive blood att

## 2019-08-04 NOTE — ED Notes (Signed)
Unable to give report at this time. RN to call to receive report.  

## 2019-08-04 NOTE — ED Notes (Signed)
Report to noel, rn.  

## 2019-08-05 ENCOUNTER — Encounter: Payer: Self-pay | Admitting: Surgery

## 2019-08-05 ENCOUNTER — Other Ambulatory Visit: Payer: Self-pay

## 2019-08-05 ENCOUNTER — Ambulatory Visit (INDEPENDENT_AMBULATORY_CARE_PROVIDER_SITE_OTHER): Payer: Self-pay | Admitting: Surgery

## 2019-08-05 VITALS — HR 69 | Temp 97.7°F | Ht 62.0 in | Wt 145.0 lb

## 2019-08-05 DIAGNOSIS — Z09 Encounter for follow-up examination after completed treatment for conditions other than malignant neoplasm: Secondary | ICD-10-CM

## 2019-08-05 NOTE — Patient Instructions (Addendum)
seguimiento aqu en 2 semanas, 8 de septiembre a las 10 am

## 2019-08-06 ENCOUNTER — Encounter: Payer: Self-pay | Admitting: Surgery

## 2019-08-06 NOTE — Progress Notes (Signed)
S/p I/d Left breast abscess No growth yet.  Some WBC present but no organisms seen on Gram stain. Did have some bleeding from her I&D site requiring 24-hour admission.  She now is doing well.  Reports no fevers no chills.  No more bleeding.  PE NAD Breast cavity 2x1 cm good granulation, Packing removed and replaced. No infection, healing well  A/ p Doing well continue daily dressing changes Complete antibiotix rx

## 2019-08-07 LAB — AEROBIC/ANAEROBIC CULTURE W GRAM STAIN (SURGICAL/DEEP WOUND)
Culture: NO GROWTH
Culture: NO GROWTH

## 2019-08-20 ENCOUNTER — Other Ambulatory Visit: Payer: Self-pay

## 2019-08-20 ENCOUNTER — Encounter: Payer: Self-pay | Admitting: Physician Assistant

## 2019-08-20 ENCOUNTER — Ambulatory Visit (INDEPENDENT_AMBULATORY_CARE_PROVIDER_SITE_OTHER): Payer: Self-pay | Admitting: Physician Assistant

## 2019-08-20 VITALS — BP 107/73 | HR 65 | Temp 97.7°F | Resp 14 | Ht 62.0 in | Wt 147.2 lb

## 2019-08-20 DIAGNOSIS — Z09 Encounter for follow-up examination after completed treatment for conditions other than malignant neoplasm: Secondary | ICD-10-CM

## 2019-08-20 DIAGNOSIS — N611 Abscess of the breast and nipple: Secondary | ICD-10-CM

## 2019-08-20 NOTE — Progress Notes (Signed)
[facility] POST-OP OFFICE VISIT  08/20/2019  HPI: Tamara Downs is a 43 y.o. female 18 days s/p left breast I&D for left breast abscess.  Overall, she reports that she is healing well. Her incision continues to heal, no longer packing wound. Finished ABx. No fever, chills, erythema, or drainage from wound. No recurrence.   Vital signs: Ht 5\' 2"  (1.575 m)   LMP 07/23/2019   BMI 26.52 kg/m    Physical Exam: Constitutional: well appearing female, NAD Left Breast: at the 10 o'clock position there is a 1 x 0.5 cm incision healing via secondary intention, no erythema, no drainage. Previous I&D sites to medial left breast well healed with induration.   Assessment/Plan: This is a 43 y.o. female 18 days s/p  left breast I&D for left breast abscess.   - pain control prn (tylenol, ibuprofen)  - continue dry dressing daily prn  - reviewed cultures; no growth  - no indication for further intervention currently  - will see again in 2 weeks for wound check  -- Edison Simon, PA-C [facility] 08/20/2019, 9:49 AM [phone] M-F: 7am - 4pm

## 2019-08-20 NOTE — Patient Instructions (Signed)
Please see your follow up appointment listed below.  °

## 2019-08-21 ENCOUNTER — Ambulatory Visit: Payer: Self-pay

## 2019-08-22 ENCOUNTER — Ambulatory Visit: Admit: 2019-08-22 | Payer: MEDICAID | Admitting: Surgery

## 2019-08-26 ENCOUNTER — Telehealth: Payer: Self-pay | Admitting: *Deleted

## 2019-08-26 ENCOUNTER — Ambulatory Visit
Admission: RE | Admit: 2019-08-26 | Discharge: 2019-08-26 | Disposition: A | Discharge status: Home / Self Care | Payer: MEDICAID | Source: Ambulatory Visit | Attending: Oncology | Admitting: Oncology

## 2019-08-26 DIAGNOSIS — N63 Unspecified lump in unspecified breast: Secondary | ICD-10-CM

## 2019-08-26 NOTE — Telephone Encounter (Signed)
Our answering service received a call from Tamara Downs on 08-24-19 stating that patient has a breast infection.   Dr. Dahlia Byes was contacted by the answering service and the answering service did try to call the patient back multiple times but reports only had a busy ring.   I did try and call patient this morning with the help of Spanish interpreter, Carina ID# [phone] but patient did not answer and interpreter left a message for the patient to call our office back. We just need to check and see how the patient is doing. Dr. Dahlia Byes available to see her this morning if she calls back.   Patient is already scheduled for an appointment with Dr. Dahlia Byes on 09-04-19.

## 2019-09-02 ENCOUNTER — Encounter: Payer: Self-pay | Admitting: Surgery

## 2019-09-04 ENCOUNTER — Encounter: Payer: Self-pay | Admitting: Surgery

## 2019-09-04 ENCOUNTER — Ambulatory Visit (INDEPENDENT_AMBULATORY_CARE_PROVIDER_SITE_OTHER): Payer: Self-pay | Admitting: Surgery

## 2019-09-04 ENCOUNTER — Other Ambulatory Visit: Payer: Self-pay

## 2019-09-04 VITALS — BP 113/78 | HR 60 | Temp 97.5°F | Resp 14 | Ht 62.0 in | Wt 147.4 lb

## 2019-09-04 DIAGNOSIS — N611 Abscess of the breast and nipple: Secondary | ICD-10-CM

## 2019-09-04 MED ORDER — SULFAMETHOXAZOLE-TRIMETHOPRIM 800-160 MG PO TABS: 1.0000 | ORAL_TABLET | Freq: Two times a day (BID) | ORAL | 0 refills | Status: DC

## 2019-09-04 NOTE — Patient Instructions (Addendum)
Please pick up your medication at the pharmacy. Please see your follow up appointment listed below.  

## 2019-09-06 NOTE — Progress Notes (Signed)
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

## 2019-09-19 ENCOUNTER — Telehealth: Payer: Self-pay | Admitting: Surgery

## 2019-09-19 NOTE — Telephone Encounter (Signed)
Telephone Triage Questions    Type of surgery? INCISION AND DRAINAGE LEFT BREAST ABSCESS                        Date?  08/02/2019                            Physician?     Dr. Dahlia Byes     Any drainage? (Color) yellowish, itching.

## 2019-09-19 NOTE — Telephone Encounter (Signed)
Spoke with patient and she states she is having drainage and request to see the doctor. Patient added to schedule 09/23/19 @ 2:45 with Dr.Pabon.

## 2019-09-23 ENCOUNTER — Encounter: Payer: Self-pay | Admitting: Surgery

## 2019-09-23 ENCOUNTER — Other Ambulatory Visit: Payer: Self-pay

## 2019-09-23 ENCOUNTER — Ambulatory Visit (INDEPENDENT_AMBULATORY_CARE_PROVIDER_SITE_OTHER): Payer: Self-pay | Admitting: Surgery

## 2019-09-23 VITALS — BP 121/73 | HR 67 | Temp 97.7°F | Resp 14 | Ht 62.0 in | Wt 146.8 lb

## 2019-09-23 DIAGNOSIS — N611 Abscess of the breast and nipple: Secondary | ICD-10-CM

## 2019-09-30 NOTE — Progress Notes (Signed)
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  She reports that she had some drainage from the left breast but now that has resolved.  She denies any fevers and chills. She does have a fibroadenoma on the right that is asymptomatic  PE NAD  Breast: There is no evidence of any open wounds there is no evidence of cellulitis.  There is some induration.  Previous scars within the left breast.  No evidence of necrotizing infection  A /P 43 year old female with granulomatous mastitis on recurrent abscesses of the left breast.  Currently the patient does not have any active infection.  I explained once again to her that this is a difficult situation and she likely will experience more infections.  I do not see any particular risk factor modifications.  We will see her in a few months

## 2019-10-14 ENCOUNTER — Other Ambulatory Visit: Payer: Self-pay

## 2019-10-14 ENCOUNTER — Ambulatory Visit (INDEPENDENT_AMBULATORY_CARE_PROVIDER_SITE_OTHER): Payer: Self-pay | Admitting: Surgery

## 2019-10-14 ENCOUNTER — Encounter: Payer: Self-pay | Admitting: Surgery

## 2019-10-14 VITALS — BP 108/64 | HR 69 | Temp 97.7°F | Ht 62.0 in | Wt 148.6 lb

## 2019-10-14 DIAGNOSIS — L299 Pruritus, unspecified: Secondary | ICD-10-CM

## 2019-10-14 DIAGNOSIS — N611 Abscess of the breast and nipple: Secondary | ICD-10-CM

## 2019-10-14 MED ORDER — TRIAMCINOLONE ACETONIDE 0.1 % EX CREA: 1.0000 "application " | TOPICAL_CREAM | Freq: Two times a day (BID) | CUTANEOUS | 0 refills | Status: AC

## 2019-10-17 ENCOUNTER — Encounter: Payer: Self-pay | Admitting: Surgery

## 2019-10-17 NOTE — Progress Notes (Signed)
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  Outer right breast mass, mobile  3 cms diameter 9 o'clock  A/p Doing well overall w/o abscess Some irritation of skin , will prescribe steroid topical No surgical intervention I spent 15 minutes in this encounter with greater than 50% spent in coordination and counseling of her care

## 2019-10-24 DIAGNOSIS — N611 Abscess of the breast and nipple: Secondary | ICD-10-CM | POA: Diagnosis not present

## 2019-11-04 ENCOUNTER — Ambulatory Visit: Payer: Self-pay | Admitting: Surgery

## 2019-11-06 ENCOUNTER — Ambulatory Visit: Payer: Self-pay | Admitting: Surgery

## 2019-11-06 DIAGNOSIS — B977 Papillomavirus as the cause of diseases classified elsewhere: Secondary | ICD-10-CM | POA: Diagnosis not present

## 2019-11-06 DIAGNOSIS — R87612 Low grade squamous intraepithelial lesion on cytologic smear of cervix (LGSIL): Secondary | ICD-10-CM | POA: Diagnosis not present

## 2019-11-06 DIAGNOSIS — R8789 Other abnormal findings in specimens from female genital organs: Secondary | ICD-10-CM | POA: Diagnosis not present

## 2019-11-06 DIAGNOSIS — N879 Dysplasia of cervix uteri, unspecified: Secondary | ICD-10-CM | POA: Diagnosis not present

## 2019-11-11 DIAGNOSIS — N6122 Granulomatous mastitis, left breast: Secondary | ICD-10-CM | POA: Diagnosis not present

## 2019-11-18 ENCOUNTER — Other Ambulatory Visit: Payer: Self-pay

## 2019-11-18 ENCOUNTER — Ambulatory Visit: Payer: Self-pay | Admitting: Surgery

## 2019-11-18 DIAGNOSIS — Z20822 Contact with and (suspected) exposure to covid-19: Secondary | ICD-10-CM

## 2019-11-19 LAB — NOVEL CORONAVIRUS, NAA: SARS-CoV-2, NAA: DETECTED — AB

## 2019-12-23 DIAGNOSIS — N6122 Granulomatous mastitis, left breast: Secondary | ICD-10-CM | POA: Diagnosis not present

## 2020-01-20 DIAGNOSIS — N6122 Granulomatous mastitis, left breast: Secondary | ICD-10-CM | POA: Diagnosis not present

## 2020-02-03 DIAGNOSIS — N6122 Granulomatous mastitis, left breast: Secondary | ICD-10-CM | POA: Diagnosis not present

## 2020-03-16 DIAGNOSIS — N61 Mastitis without abscess: Secondary | ICD-10-CM | POA: Diagnosis not present

## 2020-03-18 ENCOUNTER — Ambulatory Visit: Payer: Self-pay

## 2020-03-30 DIAGNOSIS — N6122 Granulomatous mastitis, left breast: Secondary | ICD-10-CM | POA: Diagnosis not present

## 2020-04-20 DIAGNOSIS — N6122 Granulomatous mastitis, left breast: Secondary | ICD-10-CM | POA: Diagnosis not present

## 2020-04-22 ENCOUNTER — Ambulatory Visit
Admission: RE | Admit: 2020-04-22 | Discharge: 2020-04-22 | Disposition: A | Discharge status: Home / Self Care | Payer: Self-pay | Source: Ambulatory Visit | Attending: Oncology | Admitting: Oncology

## 2020-04-22 ENCOUNTER — Ambulatory Visit: Payer: Self-pay | Attending: Oncology

## 2020-04-22 ENCOUNTER — Other Ambulatory Visit: Payer: Self-pay

## 2020-04-22 VITALS — BP 118/71 | HR 63 | Temp 98.4°F | Ht 63.0 in | Wt 158.9 lb

## 2020-04-22 DIAGNOSIS — N611 Abscess of the breast and nipple: Secondary | ICD-10-CM | POA: Insufficient documentation

## 2020-04-22 DIAGNOSIS — Z0000 Encounter for general adult medical examination without abnormal findings: Secondary | ICD-10-CM

## 2020-04-22 NOTE — Progress Notes (Signed)
  Subjective:     Patient ID: Tamara Downs, female   DOB: [birth year], 44 y.o.   MRN: [id number]  HPI   Review of Systems     Objective:   Physical Exam Chest:       Comments: Scarring from granulamatosis       Assessment:     44 year old hispanic patient returns for [facility] mammogram, and ultrasound follow-up of [facility] imaging.  She is being followed at [facility] for granulomatous mastitis.  Currently on antibiotics ,and prednisone.  Patient screened, and meets [facility] eligibility.  Patient does not have insurance, Medicare or Medicaid. Instructed patient on breast self awareness using teach back method. Clinical breast exam unremarkable for mass or lump.  No erythema or pain.  Patient points to light tan discoloration on right areola at 7 oclock. States resemble how infection looked when started in left breast. Encouraged to contact physician at [facility] if changes noted.  States she has appointment there in one month.  If radiologist recommends follow-up imaging, she would like to come here due to distance,  She lives in [place].    Plan:     Sent for bilateral diagnostic mammogram, and ultrasound.

## 2020-05-25 DIAGNOSIS — N6122 Granulomatous mastitis, left breast: Secondary | ICD-10-CM | POA: Diagnosis not present

## 2020-06-08 DIAGNOSIS — N6122 Granulomatous mastitis, left breast: Secondary | ICD-10-CM | POA: Diagnosis not present

## 2020-06-29 DIAGNOSIS — U071 COVID-19: Secondary | ICD-10-CM | POA: Diagnosis not present

## 2020-07-30 IMAGING — US US BREAST*L* LIMITED INC AXILLA
1 series · 13 of 15 positions shown · non-contrast
Comparison: Previous exam(s).

CLINICAL DATA: Mass arising 1 week ago. The patient was placed on
clindamycin. Apparently, there is redness in the region of the mass.

EXAM:
ULTRASOUND OF THE LEFT BREAST

[Series 1: us breast*left* limited inc axilla · 13 of 15 slices shown]
[im 1/15]
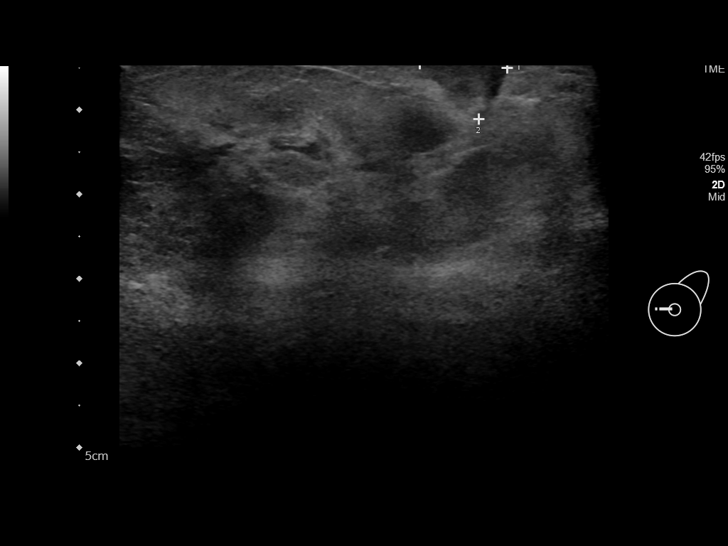
[im 2/15]
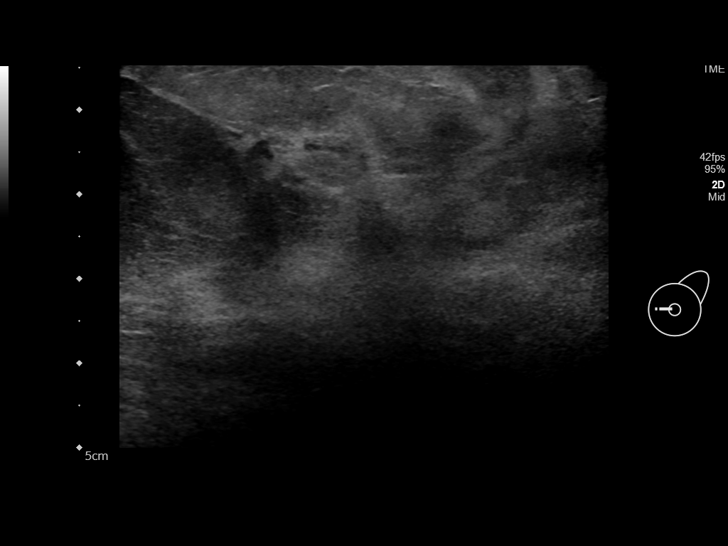
[im 3/15]
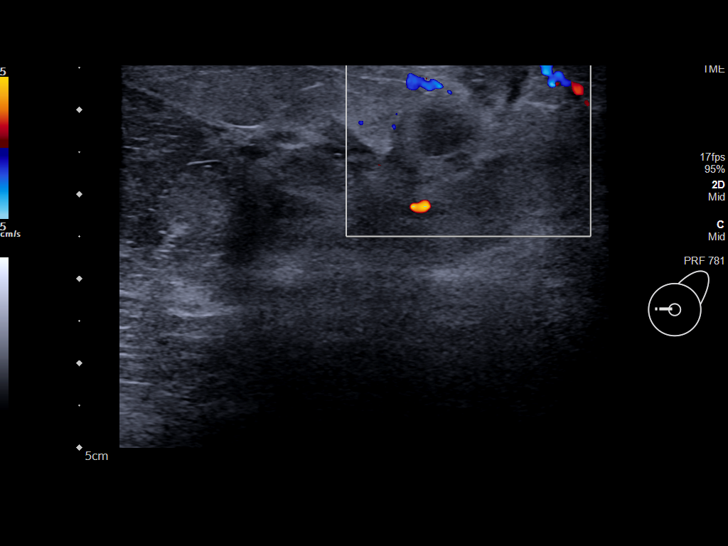
[im 5/15]
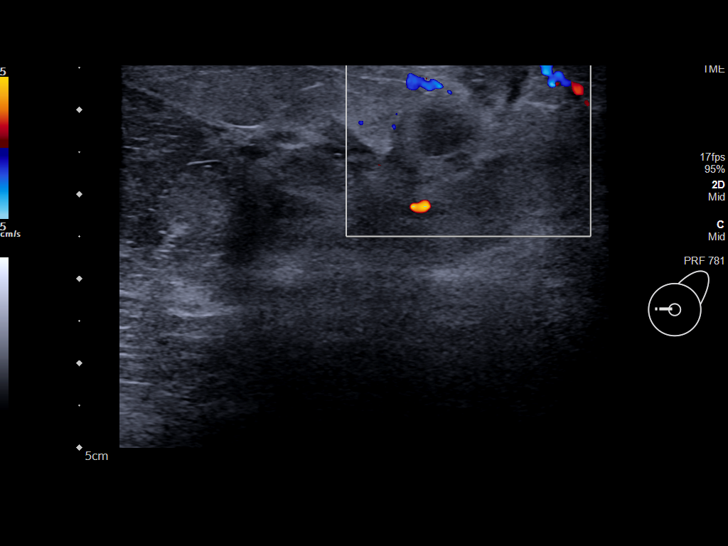
[im 6/15]
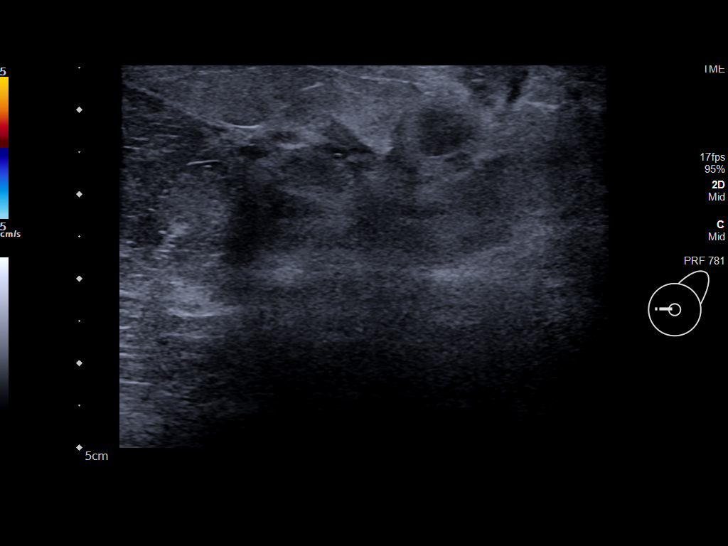
[im 7/15]
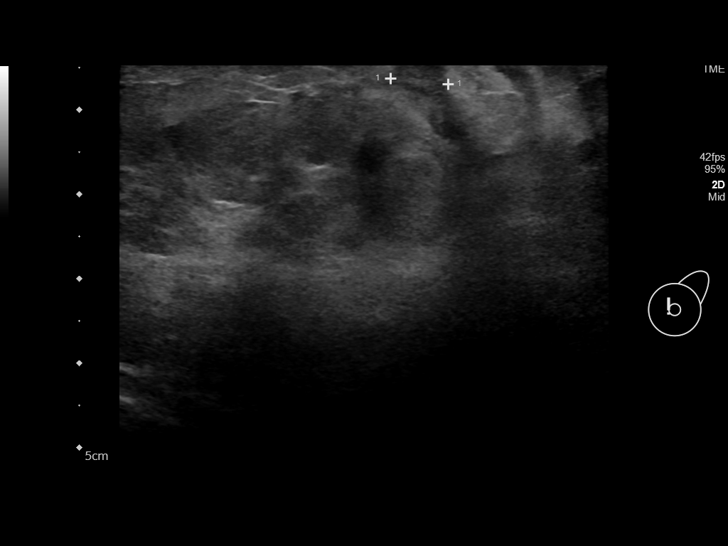
[im 8/15]
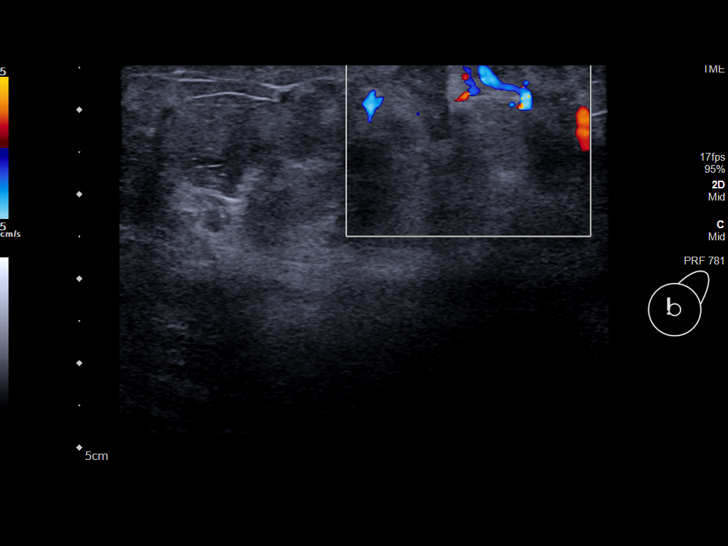
[im 9/15]
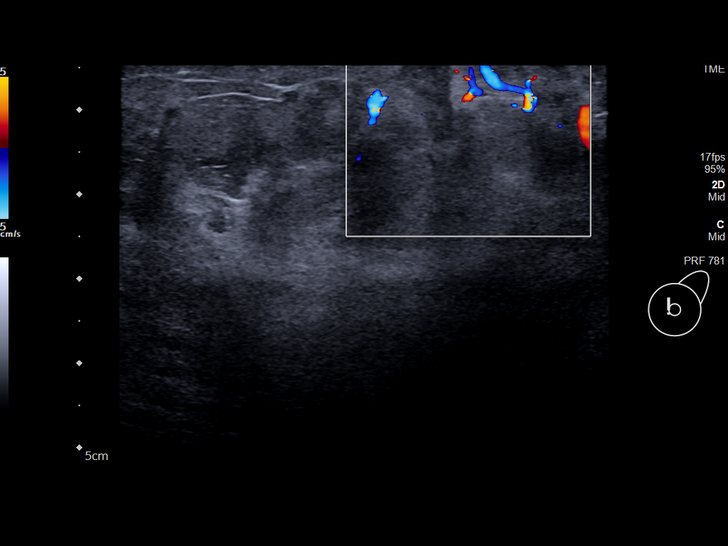
[im 10/15]
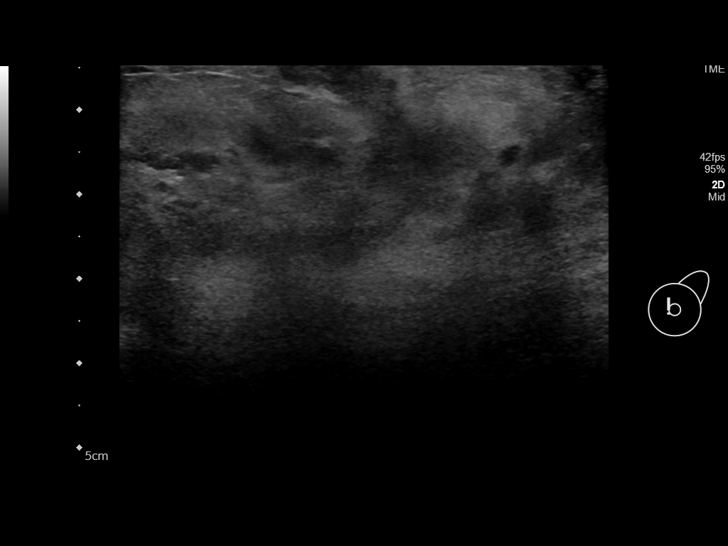
[im 11/15]
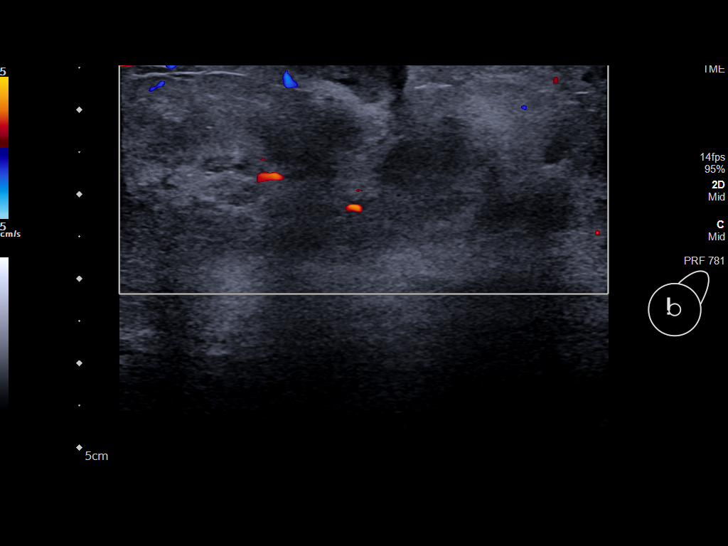
[im 13/15]
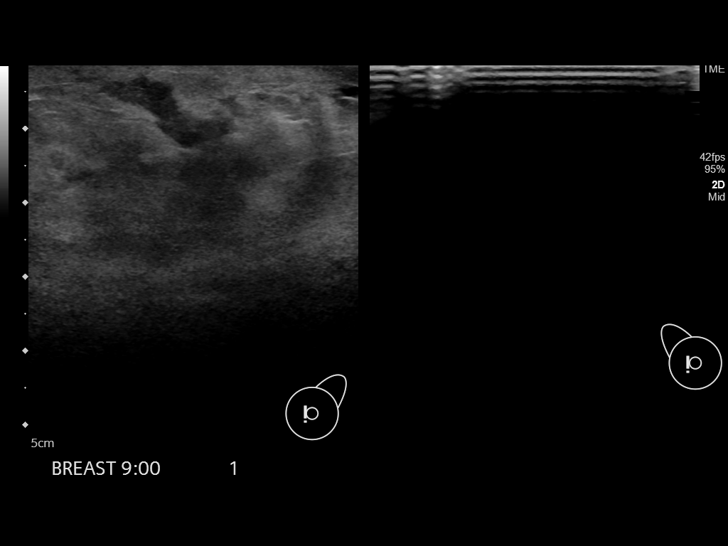
[im 14/15]
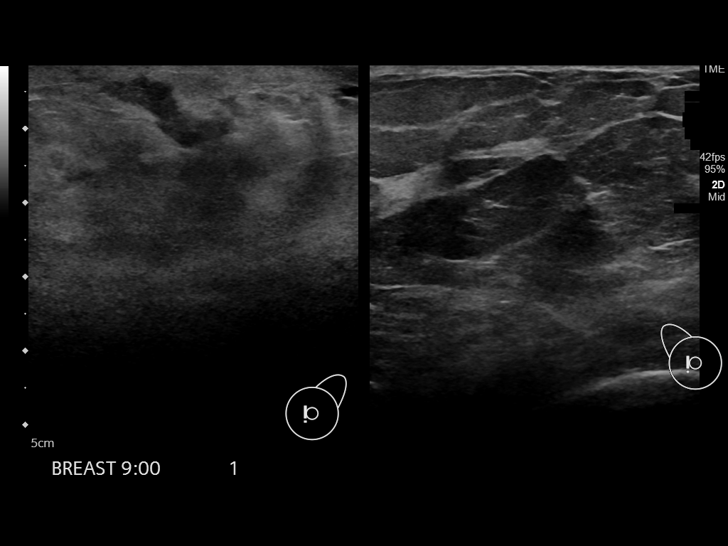
[im 15/15]
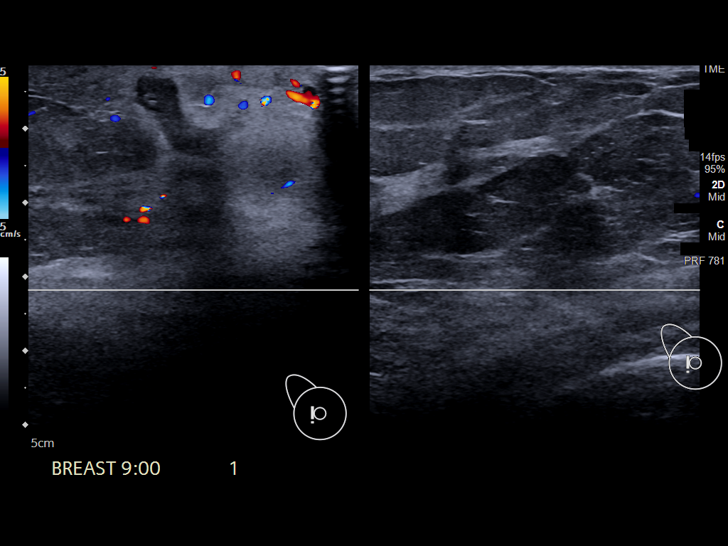

[13 of 15 positions shown; findings below may reference images not displayed]

FINDINGS: Targeted ultrasound is performed, showing a hypoechoic region at 9
o'clock measuring 1.0 x 0.8 x 0.7 cm with no internal blood flow.
Immediately adjacent to this hypoechoic region is a smaller
hypoechoic region measuring 7 x 6 mm. There may be mild hyperemia in
this region. There appears to be mild skin thickening as well.
IMPRESSION: Two rounded hypoechoic regions in the region of the patient's pain
and erythema. The larger measures 10 x 8 x 7 mm. There may be mild
hyperemia in this region. Based on imaging, this could represent 2
solid masses. However, based on history, I suspect the findings
represent 2 small abscesses or phlegmons.

RECOMMENDATION:
Recommend continuing the patient on antibiotics and sending her to
the [REDACTED] for further evaluation which would include
mammography, if the patient can tolerate, and ultrasound. Attempted
abscess drainage could be performed if clinically warranted at that
time. These findings will be need to be followed to complete
resolution.

I have discussed the findings and recommendations with the patient.
Results were also provided in writing at the conclusion of the
visit. If applicable, a reminder letter will be sent to the patient
regarding the next appointment.

BI-RADS CATEGORY  3: Probably benign.

## 2020-08-06 ENCOUNTER — Other Ambulatory Visit: Payer: Self-pay

## 2020-08-06 DIAGNOSIS — Z0000 Encounter for general adult medical examination without abnormal findings: Secondary | ICD-10-CM

## 2020-08-06 NOTE — Progress Notes (Signed)
Orders in for bilateral annual screening mammogram.  Joellyn Quails, Patient Care Coordinator, to schedule appointment.

## 2020-08-06 NOTE — Progress Notes (Signed)
Patient to return in September for annual bilateral screening mammogram.  Orders in.  Joellyn Quails, Patient Care Coordinator to schedule appointment.

## 2020-08-14 DIAGNOSIS — N611 Abscess of the breast and nipple: Secondary | ICD-10-CM | POA: Diagnosis not present

## 2020-08-24 DIAGNOSIS — N611 Abscess of the breast and nipple: Secondary | ICD-10-CM | POA: Diagnosis not present

## 2020-09-22 ENCOUNTER — Other Ambulatory Visit: Payer: Self-pay

## 2020-09-22 ENCOUNTER — Ambulatory Visit
Admission: RE | Admit: 2020-09-22 | Discharge: 2020-09-22 | Disposition: A | Discharge status: Home / Self Care | Payer: Medicaid Other | Source: Ambulatory Visit | Attending: Oncology | Admitting: Oncology

## 2020-09-22 DIAGNOSIS — Z0000 Encounter for general adult medical examination without abnormal findings: Secondary | ICD-10-CM

## 2020-09-24 ENCOUNTER — Other Ambulatory Visit: Payer: Self-pay | Admitting: *Deleted

## 2020-09-24 DIAGNOSIS — N6489 Other specified disorders of breast: Secondary | ICD-10-CM

## 2020-10-14 ENCOUNTER — Telehealth: Payer: Self-pay | Admitting: *Deleted

## 2020-10-14 NOTE — Telephone Encounter (Signed)
No response from VM's & letters to schd AV/MAMMO - Certified letter sent

## 2021-02-06 IMAGING — MG MM DIGITAL DIAGNOSTIC BILAT W/ TOMO W/ CAD
8 series · 8 of 24 positions shown · non-contrast
Comparison: Previous exam(s).

CLINICAL DATA: 43-year-old patient due for annual exam. History of
multiple medial left breast surgeries for infection, the most recent
approximately 3 weeks ago. It was noticed today in the patient's
imaging records that she had a CT chest August 04, 2019 describing a
2.9 cm mass in the right breast. The order requisition states
six-month follow-up of the left breast,, and the last time the
patient was seen 9 our department was Friday February, 2018. The patient
reports she had small amounts of drainage from her most recent
surgical site. Spanish interpreter was present today.

EXAM:
DIGITAL DIAGNOSTIC BILATERAL MAMMOGRAM WITH CAD AND TOMO
ULTRASOUND LEFT BREAST

[L MLO synth-2D]
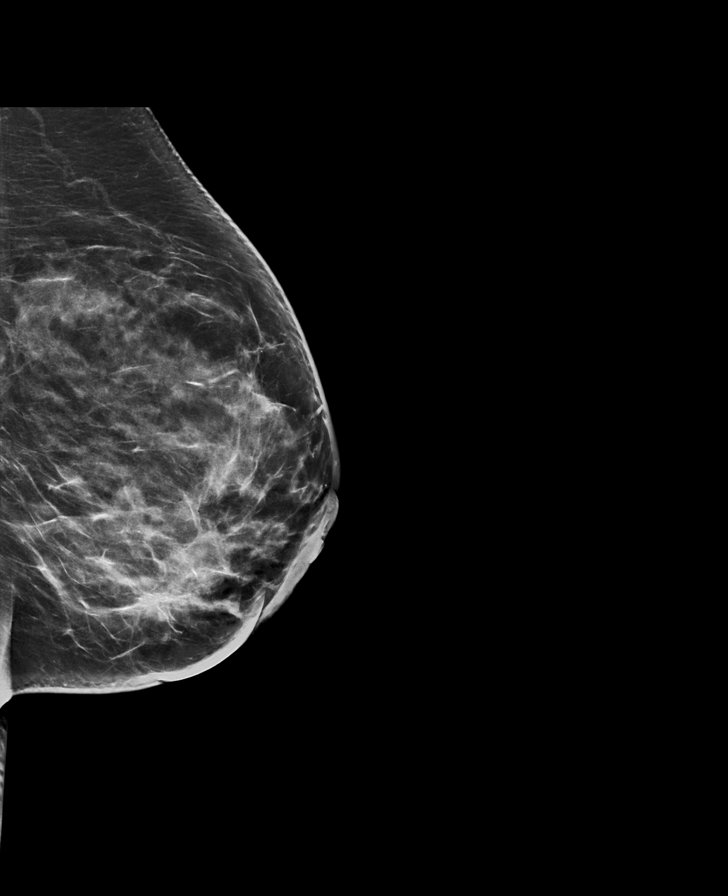

[L CC synth-2D]
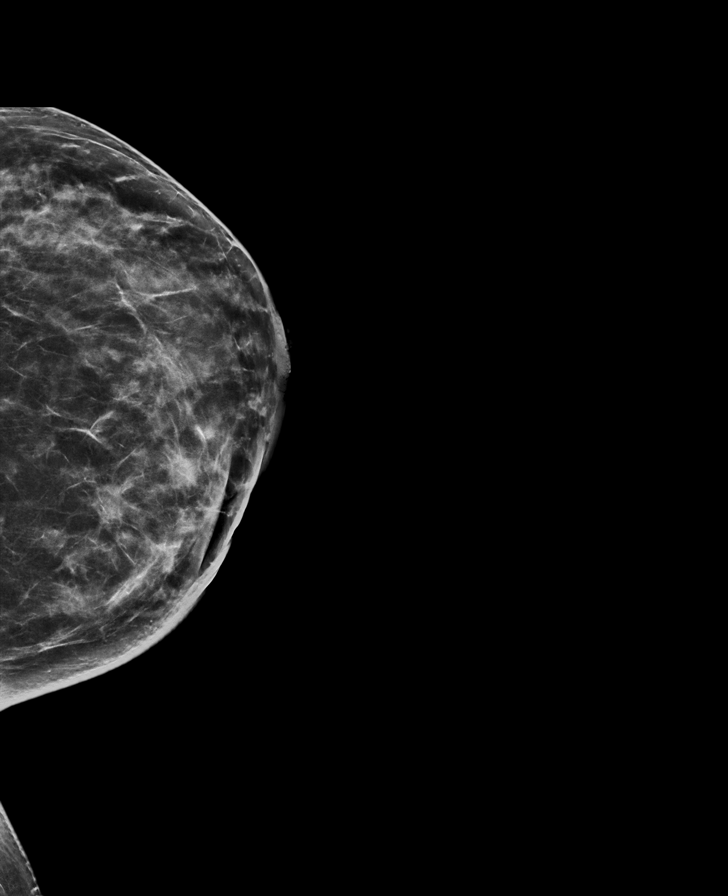

[R MLO synth-2D]
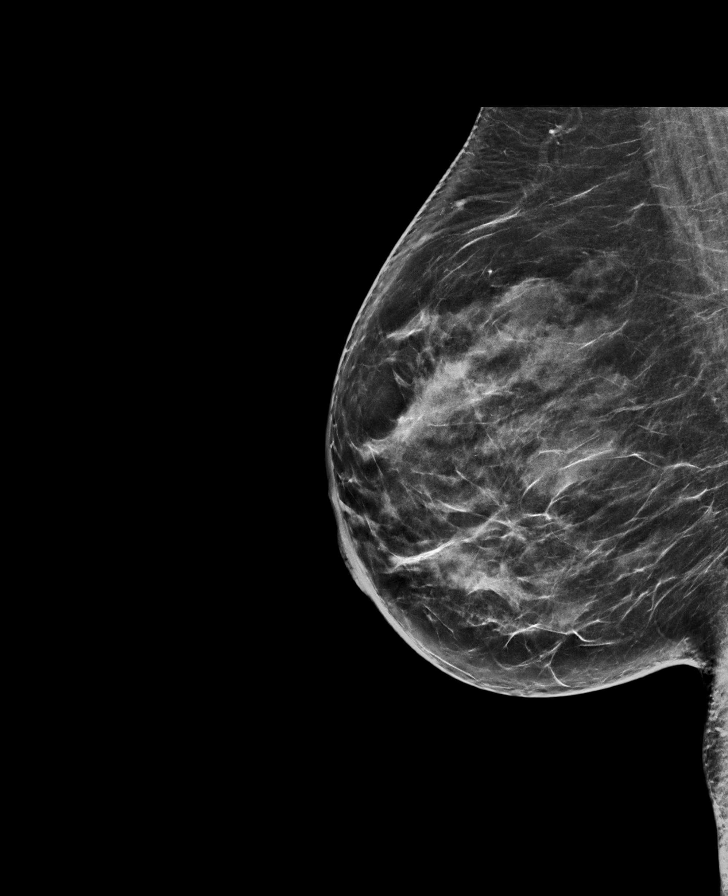

[R CC synth-2D]
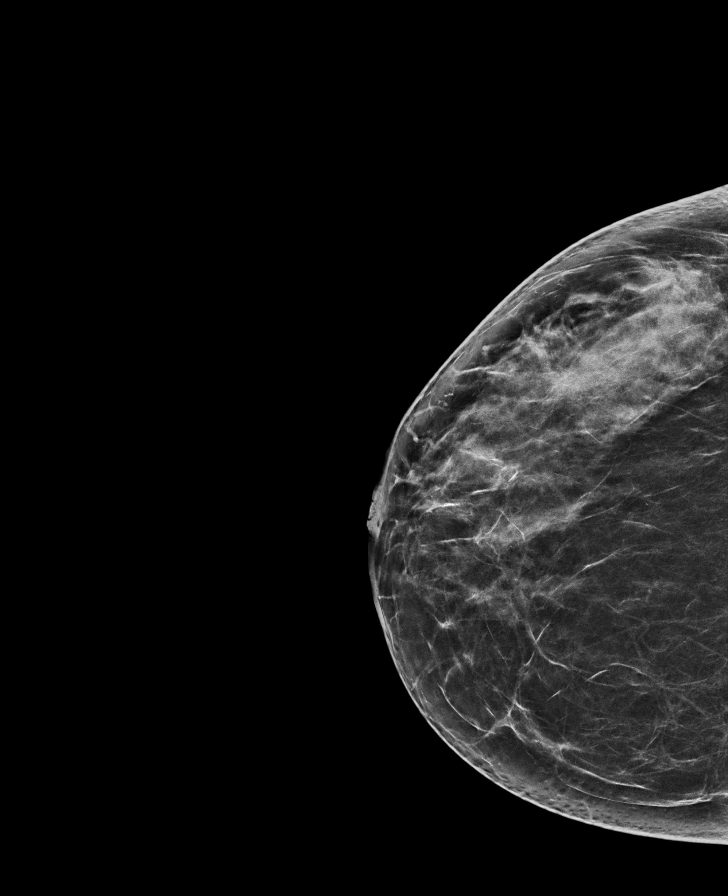

[L CC tomo · tomo slice 37/74.0]
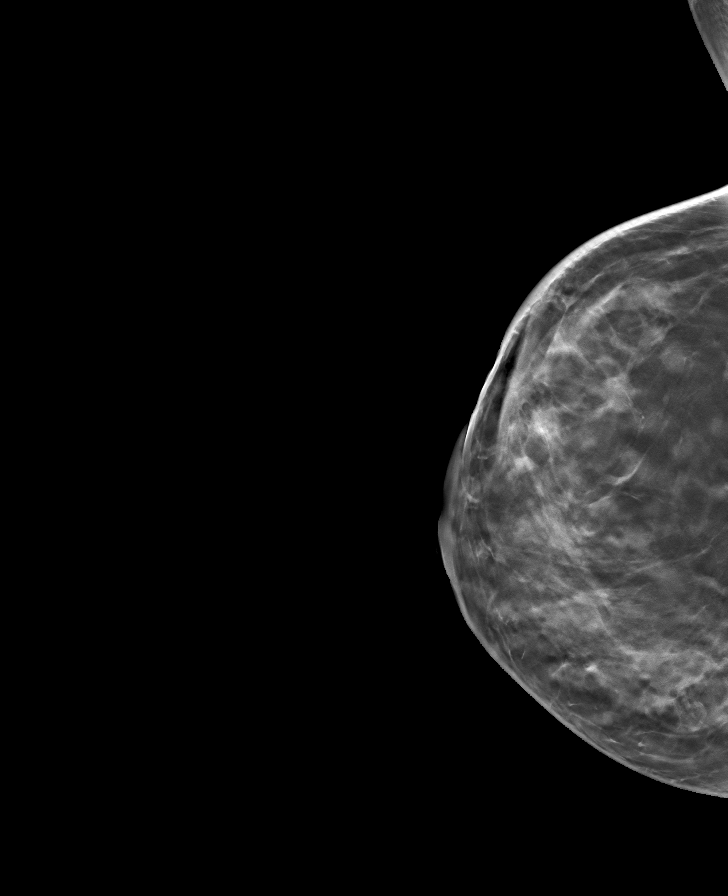

[L MLO tomo · tomo slice 39/78.0]
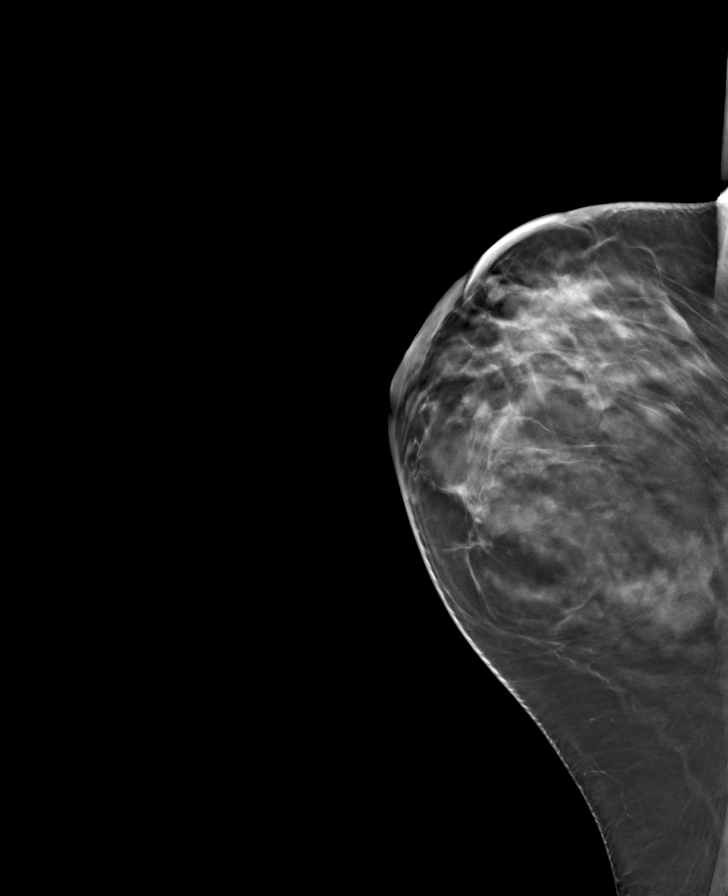

[R CC tomo · tomo slice 34/67.0]
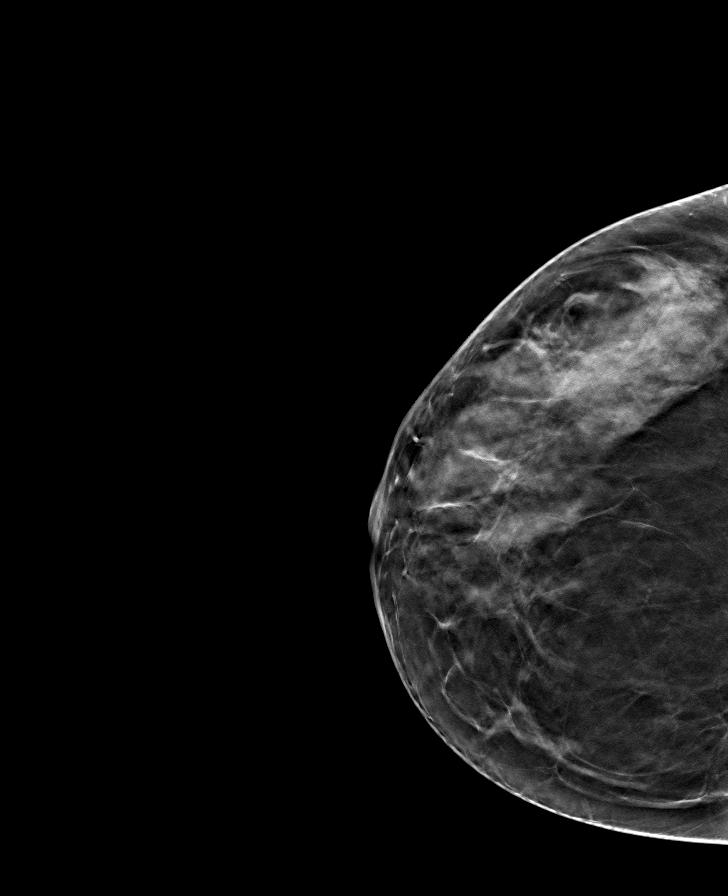

[R MLO tomo · tomo slice 37/74.0]
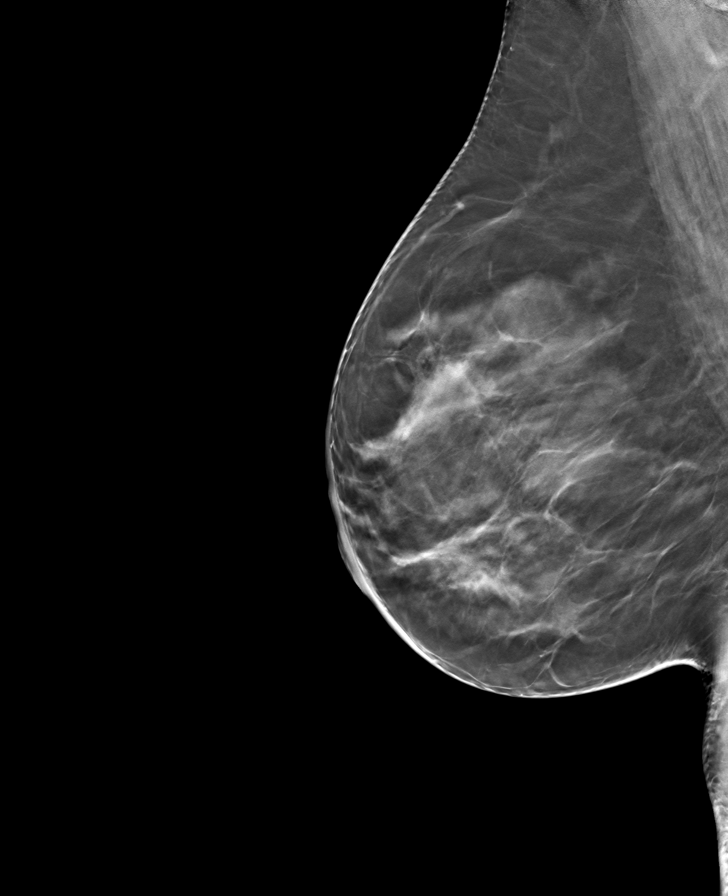

[8 of 24 positions shown; findings below may reference images not displayed]

ACR Breast Density Category c: The breast tissue is heterogeneously
dense, which may obscure small masses.
FINDINGS: There is a stable oval circumscribed mass in the outer right breast,
best seen in the MLO projection that corresponds to a previously
evaluated and stable benign fibroadenoma, as described on Dr. Germany
TOMASINI report of March 02, 2018. This mass correlates well with the
mass seen on the CT of the chest performed August 04, 2019. No new
or suspicious mass is identified in the right breast. No suspicious
microcalcification or architectural distortion is seen on the right.

There are postsurgical changes of the medial left breast with some
skin dimpling and postsurgical distortion. There is mild skin
thickening of the medial left breast. No suspicious mass or
suspicious microcalcification.

Mammographic images were processed with CAD.

On physical exam, there is skin scarring and skin dimpling of the
medial left breast related to prior surgeries in this patient with
history of left breast incision and drainage for abscess.

Targeted ultrasound of the postsurgical region of the medial left
breast real-time shows areas of surgical scarring. There is a
circumscribed hypoechoic rounded mass 9 o'clock position 2 cm from
the nipple measuring 1.2 x 1.2 x 1.0 cm. There is no associated
hyperemia or internal vascular flow.

Skin thickness of the postsurgical region of the right breast is
mm. No findings to suggest a definite residual abscess.
IMPRESSION: 1. Postsurgical changes of the medial left breast.

2. Probably benign left breast mass 9 o'clock position 2 cm from
nipple seen on ultrasound measuring 1.2 cm. This could reflect
resolving phlegmon given history of breast infection, postsurgical
fat necrosis, resolving postsurgical hematoma, or complicated cyst.

3. Stable mass in the right breast previously evaluated by
ultrasound, shown to be stable for greater than 2 years, consistent
with a fibroadenoma. This accounts for the mass seen on the CT scan
of the chest [DATE] [DATE], [DATE].

[DATE].  No evidence of malignancy in the right breast

RECOMMENDATION:
Left breast ultrasound is recommended in 6 months.

I have discussed the findings and recommendations with the patient.
If applicable, a reminder letter will be sent to the patient
regarding the next appointment.

BI-RADS CATEGORY  3: Probably benign.

## 2021-05-05 ENCOUNTER — Ambulatory Visit: Payer: Medicaid Other

## 2021-05-06 ENCOUNTER — Other Ambulatory Visit: Payer: Self-pay

## 2021-05-06 ENCOUNTER — Ambulatory Visit
Admission: RE | Admit: 2021-05-06 | Discharge: 2021-05-06 | Disposition: A | Discharge status: Home / Self Care | Payer: Self-pay | Source: Ambulatory Visit | Attending: Oncology | Admitting: Oncology

## 2021-05-06 ENCOUNTER — Ambulatory Visit
Admission: RE | Admit: 2021-05-06 | Discharge: 2021-05-06 | Disposition: A | Discharge status: Home / Self Care | Payer: Medicaid Other | Source: Ambulatory Visit | Attending: Oncology | Admitting: Oncology

## 2021-05-06 DIAGNOSIS — N6489 Other specified disorders of breast: Secondary | ICD-10-CM | POA: Insufficient documentation

## 2021-06-03 ENCOUNTER — Other Ambulatory Visit: Payer: Self-pay | Admitting: *Deleted

## 2021-06-03 ENCOUNTER — Encounter: Payer: Self-pay | Admitting: *Deleted

## 2021-06-03 DIAGNOSIS — N6489 Other specified disorders of breast: Secondary | ICD-10-CM

## 2021-06-03 NOTE — Progress Notes (Signed)
Letter mailed to inform patient of her next [facility] and mammogram appointment on 11/10/21 @ 9:00.

## 2021-08-26 DIAGNOSIS — Z1389 Encounter for screening for other disorder: Secondary | ICD-10-CM | POA: Diagnosis not present

## 2021-08-26 DIAGNOSIS — Z0000 Encounter for general adult medical examination without abnormal findings: Secondary | ICD-10-CM | POA: Diagnosis not present

## 2021-08-26 DIAGNOSIS — M545 Low back pain, unspecified: Secondary | ICD-10-CM | POA: Diagnosis not present

## 2021-08-26 DIAGNOSIS — N393 Stress incontinence (female) (male): Secondary | ICD-10-CM | POA: Diagnosis not present

## 2021-11-10 ENCOUNTER — Ambulatory Visit
Admission: RE | Admit: 2021-11-10 | Discharge: 2021-11-10 | Disposition: A | Discharge status: Home / Self Care | Payer: Medicaid Other | Source: Ambulatory Visit | Attending: Oncology | Admitting: Oncology

## 2021-11-10 ENCOUNTER — Ambulatory Visit: Payer: Medicaid Other | Attending: Oncology

## 2021-11-10 ENCOUNTER — Other Ambulatory Visit: Payer: Self-pay

## 2021-11-10 VITALS — BP 114/73 | HR 59 | Temp 97.0°F | Resp 16 | Wt 152.9 lb

## 2021-11-10 DIAGNOSIS — R922 Inconclusive mammogram: Secondary | ICD-10-CM | POA: Diagnosis not present

## 2021-11-10 DIAGNOSIS — N6489 Other specified disorders of breast: Secondary | ICD-10-CM | POA: Diagnosis not present

## 2021-11-10 DIAGNOSIS — N611 Abscess of the breast and nipple: Secondary | ICD-10-CM

## 2021-11-10 DIAGNOSIS — Z0000 Encounter for general adult medical examination without abnormal findings: Secondary | ICD-10-CM

## 2021-11-10 NOTE — Progress Notes (Signed)
  Subjective:     Patient ID: Tamara Downs, female   DOB: 09-04-1976, 45 y.o.   MRN: [id number]  HPI   Review of Systems     Objective:   Physical Exam Chest:  Breasts:    Right: No swelling, bleeding, inverted nipple, mass, nipple discharge, skin change or tenderness.     Left: No swelling, bleeding, inverted nipple, mass, nipple discharge, skin change or tenderness.       Comments: Scars from earlier abscess.      Assessment:     45 year old Hispanic patient returns for follow-up mammogram post left breast abscess.  Patient screened, and meets [facility] eligibility.  Patient does not have insurance, Medicare or Medicaid.  Instructed patient on breast self awareness using teach back method Clinical breast exam unremarkable. No mass or lump palpated.  Risk Assessment     Risk Scores       11/10/2021 04/22/2020   Last edited by: Tamara Downs, CMA Tamara Downs, RT   5-year risk: 0.3 % 0.3 %   Lifetime risk: 4.6 % 4.6 %               Plan:     Sent for a diagnostic follow-up mammogram, and ultrasound.

## 2021-11-16 ENCOUNTER — Other Ambulatory Visit: Payer: Self-pay | Admitting: Oncology

## 2021-11-16 ENCOUNTER — Other Ambulatory Visit: Payer: Self-pay

## 2021-11-16 DIAGNOSIS — N6320 Unspecified lump in the left breast, unspecified quadrant: Secondary | ICD-10-CM

## 2021-11-16 DIAGNOSIS — R928 Other abnormal and inconclusive findings on diagnostic imaging of breast: Secondary | ICD-10-CM

## 2021-11-16 DIAGNOSIS — N6489 Other specified disorders of breast: Secondary | ICD-10-CM

## 2021-11-16 NOTE — Progress Notes (Signed)
Biopsy orders placed by Dr. Orlie Dakin.

## 2021-11-29 ENCOUNTER — Ambulatory Visit
Admission: RE | Admit: 2021-11-29 | Discharge: 2021-11-29 | Disposition: A | Discharge status: Home / Self Care | Payer: Medicaid Other | Source: Ambulatory Visit | Attending: Oncology | Admitting: Oncology

## 2021-11-29 ENCOUNTER — Other Ambulatory Visit: Payer: Self-pay

## 2021-11-29 DIAGNOSIS — N6489 Other specified disorders of breast: Secondary | ICD-10-CM

## 2021-11-29 DIAGNOSIS — R928 Other abnormal and inconclusive findings on diagnostic imaging of breast: Secondary | ICD-10-CM | POA: Diagnosis not present

## 2021-11-29 HISTORY — PX: BREAST BIOPSY: SHX20

## 2021-11-30 LAB — SURGICAL PATHOLOGY

## 2021-12-10 DIAGNOSIS — F320 Major depressive disorder, single episode, mild: Secondary | ICD-10-CM | POA: Diagnosis not present

## 2021-12-10 DIAGNOSIS — Z124 Encounter for screening for malignant neoplasm of cervix: Secondary | ICD-10-CM | POA: Diagnosis not present

## 2021-12-10 DIAGNOSIS — R87810 Cervical high risk human papillomavirus (HPV) DNA test positive: Secondary | ICD-10-CM | POA: Diagnosis not present

## 2021-12-10 DIAGNOSIS — Z0000 Encounter for general adult medical examination without abnormal findings: Secondary | ICD-10-CM | POA: Diagnosis not present

## 2021-12-14 ENCOUNTER — Encounter: Payer: Self-pay | Admitting: *Deleted

## 2021-12-14 ENCOUNTER — Other Ambulatory Visit: Payer: Self-pay | Admitting: *Deleted

## 2021-12-14 DIAGNOSIS — N6320 Unspecified lump in the left breast, unspecified quadrant: Secondary | ICD-10-CM

## 2021-12-14 NOTE — Progress Notes (Signed)
Letter mailed to inform patient of the need to return in 6 months for her next mammogram and ultrasound on 05/31/22 @ 10:40.  Will transition care to Fonnie Mu, RN with [facility].   Notes cc'd to [place].

## 2021-12-23 DIAGNOSIS — F430 Acute stress reaction: Secondary | ICD-10-CM | POA: Diagnosis not present

## 2022-01-11 DIAGNOSIS — L821 Other seborrheic keratosis: Secondary | ICD-10-CM | POA: Diagnosis not present

## 2022-01-11 DIAGNOSIS — D2239 Melanocytic nevi of other parts of face: Secondary | ICD-10-CM | POA: Diagnosis not present

## 2022-01-17 DIAGNOSIS — B0089 Other herpesviral infection: Secondary | ICD-10-CM | POA: Diagnosis not present

## 2022-01-17 DIAGNOSIS — F411 Generalized anxiety disorder: Secondary | ICD-10-CM | POA: Diagnosis not present

## 2022-01-17 DIAGNOSIS — Z0000 Encounter for general adult medical examination without abnormal findings: Secondary | ICD-10-CM | POA: Diagnosis not present

## 2022-02-18 DIAGNOSIS — F411 Generalized anxiety disorder: Secondary | ICD-10-CM | POA: Diagnosis not present

## 2022-02-18 DIAGNOSIS — B0089 Other herpesviral infection: Secondary | ICD-10-CM | POA: Diagnosis not present

## 2022-02-18 DIAGNOSIS — G629 Polyneuropathy, unspecified: Secondary | ICD-10-CM | POA: Diagnosis not present

## 2022-02-18 DIAGNOSIS — R87810 Cervical high risk human papillomavirus (HPV) DNA test positive: Secondary | ICD-10-CM | POA: Diagnosis not present

## 2022-02-18 DIAGNOSIS — F320 Major depressive disorder, single episode, mild: Secondary | ICD-10-CM | POA: Diagnosis not present

## 2022-02-18 DIAGNOSIS — Z0000 Encounter for general adult medical examination without abnormal findings: Secondary | ICD-10-CM | POA: Diagnosis not present

## 2022-02-18 DIAGNOSIS — N912 Amenorrhea, unspecified: Secondary | ICD-10-CM | POA: Diagnosis not present

## 2022-02-24 DIAGNOSIS — F411 Generalized anxiety disorder: Secondary | ICD-10-CM | POA: Diagnosis not present

## 2022-02-26 DIAGNOSIS — Z0000 Encounter for general adult medical examination without abnormal findings: Secondary | ICD-10-CM | POA: Diagnosis not present

## 2022-02-26 DIAGNOSIS — N393 Stress incontinence (female) (male): Secondary | ICD-10-CM | POA: Diagnosis not present

## 2022-02-26 DIAGNOSIS — N390 Urinary tract infection, site not specified: Secondary | ICD-10-CM | POA: Diagnosis not present

## 2022-02-26 DIAGNOSIS — K219 Gastro-esophageal reflux disease without esophagitis: Secondary | ICD-10-CM | POA: Diagnosis not present

## 2022-02-26 DIAGNOSIS — Z1389 Encounter for screening for other disorder: Secondary | ICD-10-CM | POA: Diagnosis not present

## 2022-05-31 ENCOUNTER — Inpatient Hospital Stay: Admission: RE | Admit: 2022-05-31 | Payer: Medicaid Other | Source: Ambulatory Visit

## 2022-05-31 ENCOUNTER — Other Ambulatory Visit: Payer: Medicaid Other

## 2022-08-18 ENCOUNTER — Ambulatory Visit
Admission: RE | Admit: 2022-08-18 | Discharge: 2022-08-18 | Disposition: A | Discharge status: Home / Self Care | Payer: Self-pay | Source: Ambulatory Visit | Attending: Obstetrics and Gynecology | Admitting: Obstetrics and Gynecology

## 2022-08-18 DIAGNOSIS — N6320 Unspecified lump in the left breast, unspecified quadrant: Secondary | ICD-10-CM | POA: Insufficient documentation

## 2022-12-01 ENCOUNTER — Other Ambulatory Visit: Payer: Self-pay

## 2022-12-01 DIAGNOSIS — Z1231 Encounter for screening mammogram for malignant neoplasm of breast: Secondary | ICD-10-CM

## 2023-01-02 ENCOUNTER — Other Ambulatory Visit: Payer: Self-pay

## 2023-01-02 ENCOUNTER — Ambulatory Visit: Payer: Self-pay | Attending: Hematology and Oncology | Admitting: Hematology and Oncology

## 2023-01-02 ENCOUNTER — Ambulatory Visit
Admission: RE | Admit: 2023-01-02 | Discharge: 2023-01-02 | Disposition: A | Discharge status: Home / Self Care | Payer: Self-pay | Source: Ambulatory Visit | Attending: Obstetrics and Gynecology | Admitting: Obstetrics and Gynecology

## 2023-01-02 VITALS — BP 128/91 | Wt 152.3 lb

## 2023-01-02 DIAGNOSIS — Z1231 Encounter for screening mammogram for malignant neoplasm of breast: Secondary | ICD-10-CM | POA: Insufficient documentation

## 2023-01-02 DIAGNOSIS — Z1211 Encounter for screening for malignant neoplasm of colon: Secondary | ICD-10-CM

## 2023-01-02 NOTE — Patient Instructions (Signed)
[place] about BSE and gave educational materials to take home. Patient did not need a Pap smear today due to last Pap smear was in 2024 per patient. Told patient about free cervical cancer screenings to receive a Pap smear if would like one next year. Let her know [facility] will cover Pap smears every 5 years unless has a history of abnormal Pap smears. [facility] Appointment scheduled for 01/01/23. Patient aware of appointment and will be there. Let patient know will follow up with her within the next couple weeks with results. [name] verbalized understanding.  Melodye Ped, NP 9:51 AM

## 2023-01-02 NOTE — Progress Notes (Signed)
Tamara Downs is a 46 y.o. female who presents to [facility] clinic today with no complaints.    Pap Smear: Pap not smear completed today. Last Pap smear was 2024 at [facility] clinic and was normal. Per patient has history of an abnormal Pap smear. Last Pap smear result is not available in Epic. 09/2019 LSIL/ HPV+; 01/23 Negative/ HPV-; 12/2022 pending. She will be due Pap next year and explained we can do that here during visit for mammogram.    Physical exam: Breasts Breasts symmetrical. No skin abnormalities bilateral breasts. No nipple retraction bilateral breasts. No nipple discharge bilateral breasts. No lymphadenopathy. No lumps palpated bilateral breasts.     MS DIGITAL DIAG TOMO UNI LEFT  Result Date: 08/18/2022 CLINICAL DATA:  Status post ultrasound-guided biopsy of a developing asymmetry which demonstrated Pash. Short-term follow-up was recommended. EXAM: DIGITAL DIAGNOSTIC UNILATERAL LEFT MAMMOGRAM WITH TOMOSYNTHESIS TECHNIQUE: Left digital diagnostic mammography and breast tomosynthesis was performed. COMPARISON:  Previous exam(s). ACR Breast Density Category c: The breast tissue is heterogeneously dense, which may obscure small masses. FINDINGS: Diagnostic images LEFT breast demonstrate mammographic stability of a LEFT breast focal asymmetry in the upper breast at posterior depth. COIL biopsy marking clip is located within this focal asymmetry. This is consistent with biopsy-proven PASH. No suspicious mass, distortion, or microcalcifications are identified to suggest presence of malignancy. IMPRESSION: Stable appearance of a LEFT breast focal asymmetry consistent with biopsy-proven PASH. Recommend return to annual screening mammography, due December 2023. RECOMMENDATION: Annual screening mammogram, due December 2023. I have discussed the findings and recommendations with the patient in Spanish with the assistance of a Spanish interpreter. If applicable, a reminder letter will be  sent to the patient regarding the next appointment. BI-RADS CATEGORY  2: Benign. Electronically Signed   By: Valentino Saxon M.D.   On: 08/18/2022 14:31  MS CLIP PLACEMENT LEFT  Result Date: 11/29/2021 CLINICAL DATA:  Status post ultrasound-guided core needle biopsy of a 2.5 cm C shaped area of mixed hypoechoic and hyperechoic tissue in the 11 o'clock position of the left breast. EXAM: DIAGNOSTIC LEFT MAMMOGRAM POST ULTRASOUND BIOPSY COMPARISON:  Previous exam(s). FINDINGS: Mammographic images were obtained following ultrasound guided biopsy of the recently demonstrated 2.5 cm C shaped area of mixed hypoechoic and hyperechoic tissue in the 11 o'clock position of the left breast. The biopsy marking clip is in expected position at the site of biopsy. IMPRESSION: Appropriate positioning of the coil shaped biopsy marking clip at the site of biopsy in the posterior 11 o'clock position of the left breast within the recently demonstrated mammographic asymmetry. Final Assessment: Post Procedure Mammograms for Marker Placement Electronically Signed   By: Claudie Revering M.D.   On: 11/29/2021 11:56  MS DIGITAL DIAG TOMO BILAT  Result Date: 11/10/2021 CLINICAL DATA:  BI-RADS 3 follow-up of a LEFT breast asymmetry EXAM: DIGITAL DIAGNOSTIC BILATERAL MAMMOGRAM WITH TOMOSYNTHESIS AND CAD; ULTRASOUND LEFT BREAST LIMITED TECHNIQUE: Bilateral digital diagnostic mammography and breast tomosynthesis was performed. The images were evaluated with computer-aided detection.; Targeted ultrasound examination of the left breast was performed. COMPARISON:  Previous exam(s). ACR Breast Density Category c: The breast tissue is heterogeneously dense, which may obscure small masses. FINDINGS: Additional diagnostic images demonstrate a developing C-shaped focal asymmetry in the LEFT upper inner breast at far posterior depth. It is best seen on tomosynthesis MLO images, 2D MLO image as well as spot MLO slice 24. Utilizing similar 2D  techniques, this is not definitively stable dating back to remote  prior mammograms. No additional suspicious findings within the LEFT breast. Stable postsurgical changes are noted. No suspicious mass, distortion, or microcalcifications are identified to suggest presence of malignancy in the RIGHT breast. On physical exam, postsurgical changes are noted in the LEFT breast. Patient has a history of multiple infections in this breast. Targeted ultrasound was performed of the LEFT upper inner breast. There is an [place] of mixed hypoechoic and hyperechoic tissue located 11 o'clock 12 cm from the nipple. There is a portion of this area that assumes a C-shaped configuration and is favored to correspond to the developing asymmetry noted mammographically (cine image 125/164; 103 of 164). IMPRESSION: 1. There is a developing asymmetry in the LEFT upper inner breast at posterior depth. While of low suspicion, recommend ultrasound-guided biopsy for definitive characterization. 2. No mammographic evidence of malignancy in the RIGHT breast. RECOMMENDATION: LEFT breast ultrasound-guided biopsy x1 I have discussed the findings and recommendations with the patient with the assistance of a Spanish interpreter. The biopsy procedure was discussed with the patient and questions were answered. Patient expressed their understanding of the biopsy recommendation. Patient will be scheduled for biopsy at her earliest convenience by the schedulers. Ordering provider will be notified. If applicable, a reminder letter will be sent to the patient regarding the next appointment. BI-RADS CATEGORY  4: Suspicious. Electronically Signed   By: Meda Klinefelter M.D.   On: 11/10/2021 11:55  MS DIGITAL DIAG TOMO UNI LEFT  Result Date: 05/06/2021 CLINICAL DATA:  Recall from screening to evaluate possible asymmetry over the posterior mid to inner upper left breast. Patient never returned from her screening exam in October 2021 for this possible  abnormality. EXAM: DIGITAL DIAGNOSTIC UNILATERAL LEFT MAMMOGRAM WITH TOMOSYNTHESIS AND CAD; ULTRASOUND LEFT BREAST LIMITED TECHNIQUE: Left digital diagnostic mammography and breast tomosynthesis was performed. The images were evaluated with computer-aided detection.; Targeted ultrasound examination of the left breast was performed COMPARISON:  Previous exam(s). ACR Breast Density Category c: The breast tissue is heterogeneously dense, which may obscure small masses. FINDINGS: Examination demonstrates persistent mild asymmetry over the posterior mid to inner upper left breast likely asymmetric fibroglandular tissue. Remainder of the left breast is unchanged. Targeted ultrasound is performed, showing and area of asymmetric fibroglandular tissue over the posterior aspect of the 11 o'clock position of the left breast 12 cm from the nipple likely accounting for the mammographic finding. IMPRESSION: Asymmetry over the posterior upper inner left breast likely asymmetric fibroglandular tissue. RECOMMENDATION: Recommend six-month follow-up diagnostic left breast mammogram to document stability. I have discussed the findings and recommendations with the patient. If applicable, a reminder letter will be sent to the patient regarding the next appointment. BI-RADS CATEGORY  3: Probably benign. Electronically Signed   By: Elberta Fortis M.D.   On: 05/06/2021 11:25   MS DIGITAL SCREENING TOMO BILATERAL  Result Date: 09/23/2020 CLINICAL DATA:  Screening. Patient with recurrent LEFT breast infections. EXAM: DIGITAL SCREENING BILATERAL MAMMOGRAM WITH TOMO AND CAD COMPARISON:  Previous exam(s). ACR Breast Density Category c: The breast tissue is heterogeneously dense, which may obscure small masses. FINDINGS: In the left breast, a possible focal asymmetry warrants further evaluation. In the right breast, no findings suspicious for malignancy. Images were processed with CAD. IMPRESSION: Further evaluation is suggested for possible  focal asymmetry in the left breast. RECOMMENDATION: Diagnostic mammogram and possibly ultrasound of the left breast. (Code:FI-L-50M) The patient will be contacted regarding the findings, and additional imaging will be scheduled. BI-RADS CATEGORY  0: Incomplete. Need additional imaging evaluation  and/or prior mammograms for comparison. Electronically Signed   By: Meda Klinefelter MD   On: 09/23/2020 16:38   MS DIGITAL DIAG TOMO BILAT  Result Date: 08/26/2019 CLINICAL DATA:  47 year old patient due for annual exam. History of multiple medial left breast surgeries for infection, the most recent approximately 3 weeks ago. It was noticed today in the patient's imaging records that she had a CT chest August 04, 2019 describing a 2.9 cm mass in the right breast. The order requisition states six-month follow-up of the left breast,, and the last time the patient was seen 9 our department was March of 2019. The patient reports she had small amounts of drainage from her most recent surgical site. Spanish interpreter was present today. EXAM: DIGITAL DIAGNOSTIC BILATERAL MAMMOGRAM WITH CAD AND TOMO ULTRASOUND LEFT BREAST COMPARISON:  Previous exam(s). ACR Breast Density Category c: The breast tissue is heterogeneously dense, which may obscure small masses. 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 Marquis Lunch [facility] report of March 02, 2018. This mass correlates well with the mass seen on the CT of the chest performed August 04, 2019. No new or suspicious mass is identified in the right breast. No suspicious microcalcification or architectural distortion is seen on the right. There are postsurgical changes of the medial left breast with some skin dimpling and postsurgical distortion. There is mild skin thickening of the medial left breast. No suspicious mass or  suspicious microcalcification. Mammographic images were processed with CAD. On physical exam, there is skin scarring and skin dimpling of the medial left breast related to prior surgeries in this patient with history of left breast incision and drainage for abscess. Targeted ultrasound of the postsurgical region of the medial left breast real-time shows areas of surgical scarring. There is a circumscribed hypoechoic rounded mass 9 o'clock position 2 cm from the nipple measuring 1.2 x 1.2 x 1.0 cm. There is no associated hyperemia or internal vascular flow. Skin thickness of the postsurgical region of the right breast is 2.7 mm. No findings to suggest a definite residual abscess. IMPRESSION: 1. Postsurgical changes of the medial left breast. 2. Probably benign left breast mass 9 o'clock position 2 cm from nipple seen on ultrasound measuring 1.2 cm. This could reflect resolving phlegmon given history of breast infection, postsurgical fat necrosis, resolving postsurgical hematoma, or complicated cyst. 3. Stable mass in the right breast previously evaluated by ultrasound, shown to be stable for greater than 2 years, consistent with a fibroadenoma. This accounts for the mass seen on the CT scan of the chest August 04, 2019. 4.  No evidence of malignancy in the right breast RECOMMENDATION: Left breast ultrasound is recommended in 6 months. I have discussed the findings and recommendations with the patient. If applicable, a reminder letter will be sent to the patient regarding the next appointment. BI-RADS CATEGORY  3: Probably benign. Electronically Signed   By: Britta Mccreedy M.D.   On: 08/26/2019 16:22   MM DIAG BREAST TOMO BILATERAL  Result Date: 03/02/2018 CLINICAL DATA:  Follow-up for probably benign masses in the right breast.These probably benign masses were originally identified on diagnostic mammogram and ultrasound of 01/11/2016. EXAM: DIGITAL DIAGNOSTIC BILATERAL MAMMOGRAM WITH CAD AND TOMO ULTRASOUND RIGHT  BREAST COMPARISON:  Previous exam(s). ACR Breast Density Category c: The breast tissue is heterogeneously dense, which may obscure small masses. FINDINGS:  There are no new dominant masses, suspicious calcifications or secondary signs of malignancy within either breast. The dominant right breast mass appears stable mammographically. Mammographic images were processed with CAD. Targeted ultrasound is performed, again showing an oval hypoechoic mass in the right breast at the 9 o'clock axis, 3 cm from the nipple, measuring 3.3 cm, stable for greater than 2 years confirming benignity. The smaller mass at the 11 o'clock axis is also stable. The previously demonstrated mass at the 10 o'clock axis is no longer seen, also confirming benignity. IMPRESSION: No evidence of malignancy within either breast. 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 Patient may return to routine annual bilateral screening mammogram schedule. RECOMMENDATION: Screening mammogram in one year.(Code:[id number]-B-01Y) I have discussed the findings and recommendations with the patient. Results were also provided in writing at the conclusion of the visit. If applicable, a reminder letter will be sent to the patient regarding the next appointment. BI-RADS CATEGORY  2: Benign. Electronically Signed   By: Franki Cabot M.D.   On: 03/02/2018 14:55      Pelvic/Bimanual Pap is not indicated today    Smoking History: Patient has never smoked and was not referred to quit line.    Patient Navigation: Patient education provided. Access to services provided for patient through [place] interpreter provided. No transportation provided   Colorectal Cancer Screening: Per patient has never had colonoscopy completed No complaints today. FIT test given   Breast and Cervical Cancer Risk Assessment: Patient does  not have family history of breast cancer, known genetic mutations, or radiation treatment to the chest before age 54. Patient has history of cervical dysplasia, immunocompromised, or DES exposure in-utero.  Risk Assessment   No risk assessment data for the current encounter  Risk Scores       11/10/2021   Last edited by: Velna Hatchet, CMA   5-year risk: 0.3 %   Lifetime risk: 4.6 %            A: [facility] exam without pap smear No complaints with benign exam.   P: [facility]. Appointment scheduled 01/01/23.  Melodye Ped, NP 01/02/2023 9:47 AM
